# Patient Record
Sex: Female | Born: 1955 | Race: White | Hispanic: No | Marital: Married | Smoking: Never smoker
Health system: Southern US, Community
[De-identification: ages and names within clinical notes are randomized; demographics above are authoritative.]

## PROBLEM LIST (undated history)

## (undated) DIAGNOSIS — E559 Vitamin D deficiency, unspecified: Secondary | ICD-10-CM

## (undated) DIAGNOSIS — R002 Palpitations: Secondary | ICD-10-CM

## (undated) DIAGNOSIS — G8929 Other chronic pain: Secondary | ICD-10-CM

## (undated) DIAGNOSIS — K760 Fatty (change of) liver, not elsewhere classified: Secondary | ICD-10-CM

## (undated) DIAGNOSIS — R1310 Dysphagia, unspecified: Secondary | ICD-10-CM

## (undated) DIAGNOSIS — M722 Plantar fascial fibromatosis: Secondary | ICD-10-CM

## (undated) DIAGNOSIS — M51369 Other intervertebral disc degeneration, lumbar region without mention of lumbar back pain or lower extremity pain: Secondary | ICD-10-CM

## (undated) DIAGNOSIS — G4700 Insomnia, unspecified: Secondary | ICD-10-CM

## (undated) DIAGNOSIS — N289 Disorder of kidney and ureter, unspecified: Secondary | ICD-10-CM

## (undated) DIAGNOSIS — Z860100 Personal history of colon polyps, unspecified: Secondary | ICD-10-CM

## (undated) DIAGNOSIS — M7989 Other specified soft tissue disorders: Secondary | ICD-10-CM

## (undated) DIAGNOSIS — K589 Irritable bowel syndrome without diarrhea: Secondary | ICD-10-CM

## (undated) DIAGNOSIS — N059 Unspecified nephritic syndrome with unspecified morphologic changes: Secondary | ICD-10-CM

## (undated) DIAGNOSIS — E162 Hypoglycemia, unspecified: Secondary | ICD-10-CM

## (undated) DIAGNOSIS — G4733 Obstructive sleep apnea (adult) (pediatric): Secondary | ICD-10-CM

## (undated) DIAGNOSIS — M549 Dorsalgia, unspecified: Secondary | ICD-10-CM

## (undated) DIAGNOSIS — C4490 Unspecified malignant neoplasm of skin, unspecified: Secondary | ICD-10-CM

## (undated) DIAGNOSIS — K5900 Constipation, unspecified: Secondary | ICD-10-CM

## (undated) DIAGNOSIS — W3400XA Accidental discharge from unspecified firearms or gun, initial encounter: Secondary | ICD-10-CM

## (undated) DIAGNOSIS — K219 Gastro-esophageal reflux disease without esophagitis: Secondary | ICD-10-CM

## (undated) DIAGNOSIS — E119 Type 2 diabetes mellitus without complications: Secondary | ICD-10-CM

## (undated) DIAGNOSIS — Q210 Ventricular septal defect: Secondary | ICD-10-CM

## (undated) DIAGNOSIS — G9332 Myalgic encephalomyelitis/chronic fatigue syndrome: Secondary | ICD-10-CM

## (undated) DIAGNOSIS — I872 Venous insufficiency (chronic) (peripheral): Secondary | ICD-10-CM

## (undated) DIAGNOSIS — M797 Fibromyalgia: Secondary | ICD-10-CM

## (undated) DIAGNOSIS — M5136 Other intervertebral disc degeneration, lumbar region: Secondary | ICD-10-CM

## (undated) DIAGNOSIS — Y249XXA Unspecified firearm discharge, undetermined intent, initial encounter: Secondary | ICD-10-CM

## (undated) DIAGNOSIS — G4730 Sleep apnea, unspecified: Secondary | ICD-10-CM

## (undated) DIAGNOSIS — R5382 Chronic fatigue, unspecified: Secondary | ICD-10-CM

## (undated) DIAGNOSIS — I10 Essential (primary) hypertension: Secondary | ICD-10-CM

## (undated) DIAGNOSIS — E785 Hyperlipidemia, unspecified: Secondary | ICD-10-CM

## (undated) DIAGNOSIS — R609 Edema, unspecified: Secondary | ICD-10-CM

## (undated) DIAGNOSIS — M1990 Unspecified osteoarthritis, unspecified site: Secondary | ICD-10-CM

## (undated) DIAGNOSIS — M2550 Pain in unspecified joint: Secondary | ICD-10-CM

## (undated) HISTORY — DX: Palpitations: R00.2

## (undated) HISTORY — DX: Plantar fascial fibromatosis: M72.2

## (undated) HISTORY — PX: SKIN LESION EXCISION: SHX2412

## (undated) HISTORY — DX: Fatty (change of) liver, not elsewhere classified: K76.0

## (undated) HISTORY — DX: Dysphagia, unspecified: R13.10

## (undated) HISTORY — DX: Type 2 diabetes mellitus without complications: E11.9

## (undated) HISTORY — DX: Gastro-esophageal reflux disease without esophagitis: K21.9

## (undated) HISTORY — DX: Hypoglycemia, unspecified: E16.2

## (undated) HISTORY — DX: Irritable bowel syndrome, unspecified: K58.9

## (undated) HISTORY — DX: Ventricular septal defect: Q21.0

## (undated) HISTORY — DX: Constipation, unspecified: K59.00

## (undated) HISTORY — DX: Other specified soft tissue disorders: M79.89

## (undated) HISTORY — DX: Unspecified osteoarthritis, unspecified site: M19.90

## (undated) HISTORY — DX: Venous insufficiency (chronic) (peripheral): I87.2

## (undated) HISTORY — DX: Vitamin D deficiency, unspecified: E55.9

## (undated) HISTORY — PX: OTHER SURGICAL HISTORY: SHX169

## (undated) HISTORY — DX: Disorder of kidney and ureter, unspecified: N28.9

## (undated) HISTORY — DX: Morbid (severe) obesity due to excess calories: E66.01

## (undated) HISTORY — DX: Obstructive sleep apnea (adult) (pediatric): G47.33

## (undated) HISTORY — PX: COLONOSCOPY W/ POLYPECTOMY: SHX1380

## (undated) HISTORY — DX: Insomnia, unspecified: G47.00

## (undated) HISTORY — DX: Pain in unspecified joint: M25.50

## (undated) HISTORY — DX: Other intervertebral disc degeneration, lumbar region: M51.36

## (undated) HISTORY — DX: Essential (primary) hypertension: I10

## (undated) HISTORY — DX: Unspecified nephritic syndrome with unspecified morphologic changes: N05.9

## (undated) HISTORY — PX: CATARACT EXTRACTION: SUR2

## (undated) HISTORY — DX: Chronic fatigue, unspecified: R53.82

## (undated) HISTORY — DX: Myalgic encephalomyelitis/chronic fatigue syndrome: G93.32

## (undated) HISTORY — DX: Fibromyalgia: M79.7

## (undated) HISTORY — DX: Dorsalgia, unspecified: M54.9

## (undated) HISTORY — DX: Other chronic pain: G89.29

## (undated) HISTORY — DX: Unspecified malignant neoplasm of skin, unspecified: C44.90

## (undated) HISTORY — DX: Edema, unspecified: R60.9

## (undated) HISTORY — DX: Other intervertebral disc degeneration, lumbar region without mention of lumbar back pain or lower extremity pain: M51.369

## (undated) HISTORY — DX: Sleep apnea, unspecified: G47.30

## (undated) HISTORY — DX: Hyperlipidemia, unspecified: E78.5

## (undated) HISTORY — DX: Personal history of colon polyps, unspecified: Z86.0100

## (undated) HISTORY — DX: Personal history of colonic polyps: Z86.010

---

## 1983-06-24 HISTORY — PX: TUBAL LIGATION: SHX77

## 1991-06-24 HISTORY — PX: GALLBLADDER SURGERY: SHX652

## 1995-06-24 HISTORY — PX: PARTIAL HYSTERECTOMY: SHX80

## 1998-01-23 ENCOUNTER — Ambulatory Visit (HOSPITAL_COMMUNITY): Admission: RE | Admit: 1998-01-23 | Discharge: 1998-01-23 | Payer: Self-pay | Admitting: Unknown Physician Specialty

## 1999-12-20 ENCOUNTER — Ambulatory Visit (HOSPITAL_COMMUNITY): Admission: RE | Admit: 1999-12-20 | Discharge: 1999-12-20 | Payer: Self-pay | Admitting: Unknown Physician Specialty

## 1999-12-20 ENCOUNTER — Encounter: Payer: Self-pay | Admitting: Unknown Physician Specialty

## 2001-12-15 ENCOUNTER — Encounter: Payer: Self-pay | Admitting: Unknown Physician Specialty

## 2001-12-15 ENCOUNTER — Ambulatory Visit (HOSPITAL_COMMUNITY): Admission: RE | Admit: 2001-12-15 | Discharge: 2001-12-15 | Payer: Self-pay | Admitting: Unknown Physician Specialty

## 2001-12-20 ENCOUNTER — Encounter: Admission: RE | Admit: 2001-12-20 | Discharge: 2001-12-20 | Payer: Self-pay | Admitting: Unknown Physician Specialty

## 2001-12-20 ENCOUNTER — Encounter: Payer: Self-pay | Admitting: Unknown Physician Specialty

## 2002-12-19 ENCOUNTER — Ambulatory Visit (HOSPITAL_COMMUNITY): Admission: RE | Admit: 2002-12-19 | Discharge: 2002-12-19 | Payer: Self-pay | Admitting: Unknown Physician Specialty

## 2002-12-19 ENCOUNTER — Encounter: Payer: Self-pay | Admitting: Unknown Physician Specialty

## 2004-05-03 ENCOUNTER — Ambulatory Visit (HOSPITAL_COMMUNITY): Admission: RE | Admit: 2004-05-03 | Discharge: 2004-05-03 | Payer: Self-pay | Admitting: Obstetrics and Gynecology

## 2005-09-18 ENCOUNTER — Emergency Department (HOSPITAL_COMMUNITY): Admission: EM | Admit: 2005-09-18 | Discharge: 2005-09-18 | Payer: Self-pay | Admitting: Emergency Medicine

## 2005-09-23 ENCOUNTER — Ambulatory Visit (HOSPITAL_COMMUNITY): Admission: RE | Admit: 2005-09-23 | Discharge: 2005-09-23 | Payer: Self-pay | Admitting: Unknown Physician Specialty

## 2006-11-13 ENCOUNTER — Ambulatory Visit (HOSPITAL_COMMUNITY): Admission: RE | Admit: 2006-11-13 | Discharge: 2006-11-13 | Payer: Self-pay | Admitting: Unknown Physician Specialty

## 2007-11-16 ENCOUNTER — Ambulatory Visit (HOSPITAL_COMMUNITY): Admission: RE | Admit: 2007-11-16 | Discharge: 2007-11-16 | Payer: Self-pay | Admitting: Unknown Physician Specialty

## 2008-12-19 ENCOUNTER — Ambulatory Visit (HOSPITAL_COMMUNITY): Admission: RE | Admit: 2008-12-19 | Discharge: 2008-12-19 | Payer: Self-pay | Admitting: Unknown Physician Specialty

## 2010-08-09 ENCOUNTER — Other Ambulatory Visit: Payer: Self-pay | Admitting: *Deleted

## 2010-08-09 DIAGNOSIS — Z1231 Encounter for screening mammogram for malignant neoplasm of breast: Secondary | ICD-10-CM

## 2010-08-23 ENCOUNTER — Ambulatory Visit
Admission: RE | Admit: 2010-08-23 | Discharge: 2010-08-23 | Disposition: A | Discharge status: Home / Self Care | Payer: BC Managed Care – PPO | Source: Ambulatory Visit | Attending: *Deleted | Admitting: *Deleted

## 2010-08-23 DIAGNOSIS — Z1231 Encounter for screening mammogram for malignant neoplasm of breast: Secondary | ICD-10-CM

## 2010-08-27 ENCOUNTER — Other Ambulatory Visit: Payer: Self-pay | Admitting: *Deleted

## 2010-08-27 DIAGNOSIS — R928 Other abnormal and inconclusive findings on diagnostic imaging of breast: Secondary | ICD-10-CM

## 2010-09-02 ENCOUNTER — Ambulatory Visit
Admission: RE | Admit: 2010-09-02 | Discharge: 2010-09-02 | Disposition: A | Discharge status: Home / Self Care | Payer: BC Managed Care – PPO | Source: Ambulatory Visit | Attending: *Deleted | Admitting: *Deleted

## 2010-09-02 ENCOUNTER — Other Ambulatory Visit: Payer: Self-pay | Admitting: *Deleted

## 2010-09-02 DIAGNOSIS — R928 Other abnormal and inconclusive findings on diagnostic imaging of breast: Secondary | ICD-10-CM

## 2011-03-18 ENCOUNTER — Other Ambulatory Visit: Payer: Self-pay | Admitting: Unknown Physician Specialty

## 2011-03-18 DIAGNOSIS — N63 Unspecified lump in unspecified breast: Secondary | ICD-10-CM

## 2011-03-26 ENCOUNTER — Ambulatory Visit
Admission: RE | Admit: 2011-03-26 | Discharge: 2011-03-26 | Disposition: A | Discharge status: Home / Self Care | Payer: BC Managed Care – PPO | Source: Ambulatory Visit | Attending: Unknown Physician Specialty | Admitting: Unknown Physician Specialty

## 2011-03-26 DIAGNOSIS — N63 Unspecified lump in unspecified breast: Secondary | ICD-10-CM

## 2012-01-29 ENCOUNTER — Other Ambulatory Visit: Payer: Self-pay | Admitting: Unknown Physician Specialty

## 2012-01-29 DIAGNOSIS — N63 Unspecified lump in unspecified breast: Secondary | ICD-10-CM

## 2012-02-09 ENCOUNTER — Ambulatory Visit
Admission: RE | Admit: 2012-02-09 | Discharge: 2012-02-09 | Disposition: A | Discharge status: Home / Self Care | Payer: BC Managed Care – PPO | Source: Ambulatory Visit | Attending: Unknown Physician Specialty | Admitting: Unknown Physician Specialty

## 2012-02-09 DIAGNOSIS — N63 Unspecified lump in unspecified breast: Secondary | ICD-10-CM

## 2012-06-17 ENCOUNTER — Other Ambulatory Visit: Payer: Self-pay | Admitting: Physical Medicine and Rehabilitation

## 2012-06-17 DIAGNOSIS — M545 Low back pain: Secondary | ICD-10-CM

## 2012-06-18 ENCOUNTER — Other Ambulatory Visit: Payer: Self-pay | Admitting: Orthopedic Surgery

## 2012-06-18 DIAGNOSIS — M545 Low back pain: Secondary | ICD-10-CM

## 2012-06-19 ENCOUNTER — Ambulatory Visit
Admission: RE | Admit: 2012-06-19 | Discharge: 2012-06-19 | Disposition: A | Discharge status: Home / Self Care | Payer: BC Managed Care – PPO | Source: Ambulatory Visit | Attending: Orthopedic Surgery | Admitting: Orthopedic Surgery

## 2012-06-19 DIAGNOSIS — M545 Low back pain: Secondary | ICD-10-CM

## 2013-01-18 ENCOUNTER — Other Ambulatory Visit: Payer: Self-pay

## 2013-01-18 DIAGNOSIS — Z1231 Encounter for screening mammogram for malignant neoplasm of breast: Secondary | ICD-10-CM

## 2013-01-25 ENCOUNTER — Other Ambulatory Visit: Payer: Self-pay | Admitting: Physical Medicine and Rehabilitation

## 2013-01-25 ENCOUNTER — Ambulatory Visit
Admission: RE | Admit: 2013-01-25 | Discharge: 2013-01-25 | Disposition: A | Discharge status: Home / Self Care | Payer: BC Managed Care – PPO | Source: Ambulatory Visit | Attending: Physical Medicine and Rehabilitation | Admitting: Physical Medicine and Rehabilitation

## 2013-01-25 DIAGNOSIS — R609 Edema, unspecified: Secondary | ICD-10-CM

## 2013-01-25 DIAGNOSIS — R52 Pain, unspecified: Secondary | ICD-10-CM

## 2013-02-14 ENCOUNTER — Ambulatory Visit
Admission: RE | Admit: 2013-02-14 | Discharge: 2013-02-14 | Disposition: A | Discharge status: Home / Self Care | Payer: BC Managed Care – PPO | Source: Ambulatory Visit

## 2013-02-14 DIAGNOSIS — Z1231 Encounter for screening mammogram for malignant neoplasm of breast: Secondary | ICD-10-CM

## 2014-03-08 ENCOUNTER — Other Ambulatory Visit: Payer: Self-pay

## 2014-03-08 DIAGNOSIS — Z1231 Encounter for screening mammogram for malignant neoplasm of breast: Secondary | ICD-10-CM

## 2014-03-17 ENCOUNTER — Ambulatory Visit
Admission: RE | Admit: 2014-03-17 | Discharge: 2014-03-17 | Disposition: A | Discharge status: Home / Self Care | Payer: BC Managed Care – PPO | Source: Ambulatory Visit

## 2014-03-17 DIAGNOSIS — Z1231 Encounter for screening mammogram for malignant neoplasm of breast: Secondary | ICD-10-CM

## 2015-04-13 ENCOUNTER — Other Ambulatory Visit: Payer: Self-pay

## 2015-04-13 DIAGNOSIS — Z1231 Encounter for screening mammogram for malignant neoplasm of breast: Secondary | ICD-10-CM

## 2015-05-02 ENCOUNTER — Ambulatory Visit: Payer: BC Managed Care – PPO

## 2015-05-04 ENCOUNTER — Ambulatory Visit
Admission: RE | Admit: 2015-05-04 | Discharge: 2015-05-04 | Disposition: A | Discharge status: Home / Self Care | Payer: BC Managed Care – PPO | Source: Ambulatory Visit

## 2015-05-04 DIAGNOSIS — Z1231 Encounter for screening mammogram for malignant neoplasm of breast: Secondary | ICD-10-CM

## 2015-06-24 HISTORY — PX: MICRODISCECTOMY LUMBAR: SUR864

## 2015-09-14 ENCOUNTER — Other Ambulatory Visit: Payer: Self-pay | Admitting: Orthopaedic Surgery

## 2015-09-14 DIAGNOSIS — M4716 Other spondylosis with myelopathy, lumbar region: Secondary | ICD-10-CM

## 2015-09-23 ENCOUNTER — Ambulatory Visit
Admission: RE | Admit: 2015-09-23 | Discharge: 2015-09-23 | Disposition: A | Discharge status: Home / Self Care | Payer: BC Managed Care – PPO | Source: Ambulatory Visit | Attending: Orthopaedic Surgery | Admitting: Orthopaedic Surgery

## 2015-09-23 DIAGNOSIS — M4716 Other spondylosis with myelopathy, lumbar region: Secondary | ICD-10-CM

## 2016-06-03 ENCOUNTER — Other Ambulatory Visit: Payer: Self-pay | Admitting: Family Medicine

## 2016-06-03 DIAGNOSIS — Z1231 Encounter for screening mammogram for malignant neoplasm of breast: Secondary | ICD-10-CM

## 2016-07-01 ENCOUNTER — Ambulatory Visit
Admission: RE | Admit: 2016-07-01 | Discharge: 2016-07-01 | Disposition: A | Discharge status: Home / Self Care | Payer: BC Managed Care – PPO | Source: Ambulatory Visit | Attending: Family Medicine | Admitting: Family Medicine

## 2016-07-01 DIAGNOSIS — Z1231 Encounter for screening mammogram for malignant neoplasm of breast: Secondary | ICD-10-CM

## 2016-07-22 DIAGNOSIS — M961 Postlaminectomy syndrome, not elsewhere classified: Secondary | ICD-10-CM | POA: Insufficient documentation

## 2017-02-20 DIAGNOSIS — M1711 Unilateral primary osteoarthritis, right knee: Secondary | ICD-10-CM | POA: Insufficient documentation

## 2017-05-19 DIAGNOSIS — M8949 Other hypertrophic osteoarthropathy, multiple sites: Secondary | ICD-10-CM | POA: Insufficient documentation

## 2017-05-19 DIAGNOSIS — M159 Polyosteoarthritis, unspecified: Secondary | ICD-10-CM | POA: Insufficient documentation

## 2017-09-02 ENCOUNTER — Other Ambulatory Visit: Payer: Self-pay | Admitting: Family Medicine

## 2017-09-02 DIAGNOSIS — Z1231 Encounter for screening mammogram for malignant neoplasm of breast: Secondary | ICD-10-CM

## 2017-09-04 ENCOUNTER — Ambulatory Visit
Admission: RE | Admit: 2017-09-04 | Discharge: 2017-09-04 | Disposition: A | Discharge status: Home / Self Care | Payer: BC Managed Care – PPO | Source: Ambulatory Visit | Attending: Family Medicine | Admitting: Family Medicine

## 2017-09-04 DIAGNOSIS — Z1231 Encounter for screening mammogram for malignant neoplasm of breast: Secondary | ICD-10-CM

## 2018-11-08 ENCOUNTER — Other Ambulatory Visit: Payer: Self-pay | Admitting: Internal Medicine

## 2018-11-08 DIAGNOSIS — Z1231 Encounter for screening mammogram for malignant neoplasm of breast: Secondary | ICD-10-CM

## 2018-12-04 ENCOUNTER — Ambulatory Visit
Admission: RE | Admit: 2018-12-04 | Discharge: 2018-12-04 | Disposition: A | Discharge status: Home / Self Care | Payer: BC Managed Care – PPO | Source: Ambulatory Visit | Attending: Internal Medicine | Admitting: Internal Medicine

## 2018-12-04 ENCOUNTER — Other Ambulatory Visit: Payer: Self-pay

## 2018-12-04 DIAGNOSIS — Z1231 Encounter for screening mammogram for malignant neoplasm of breast: Secondary | ICD-10-CM

## 2018-12-23 DIAGNOSIS — M1711 Unilateral primary osteoarthritis, right knee: Secondary | ICD-10-CM | POA: Diagnosis not present

## 2018-12-23 DIAGNOSIS — M1712 Unilateral primary osteoarthritis, left knee: Secondary | ICD-10-CM | POA: Diagnosis not present

## 2018-12-23 DIAGNOSIS — M1612 Unilateral primary osteoarthritis, left hip: Secondary | ICD-10-CM | POA: Diagnosis not present

## 2018-12-23 DIAGNOSIS — Z6841 Body Mass Index (BMI) 40.0 and over, adult: Secondary | ICD-10-CM | POA: Diagnosis not present

## 2019-01-12 ENCOUNTER — Encounter (INDEPENDENT_AMBULATORY_CARE_PROVIDER_SITE_OTHER): Payer: Self-pay | Admitting: Family Medicine

## 2019-01-12 ENCOUNTER — Other Ambulatory Visit: Payer: Self-pay

## 2019-01-12 ENCOUNTER — Ambulatory Visit (INDEPENDENT_AMBULATORY_CARE_PROVIDER_SITE_OTHER): Payer: PPO | Admitting: Family Medicine

## 2019-01-12 VITALS — BP 122/74 | HR 65 | Temp 98.1°F | Ht 63.0 in | Wt 251.0 lb

## 2019-01-12 DIAGNOSIS — R5383 Other fatigue: Secondary | ICD-10-CM

## 2019-01-12 DIAGNOSIS — Z9189 Other specified personal risk factors, not elsewhere classified: Secondary | ICD-10-CM

## 2019-01-12 DIAGNOSIS — R0602 Shortness of breath: Secondary | ICD-10-CM | POA: Diagnosis not present

## 2019-01-12 DIAGNOSIS — E559 Vitamin D deficiency, unspecified: Secondary | ICD-10-CM

## 2019-01-12 DIAGNOSIS — I10 Essential (primary) hypertension: Secondary | ICD-10-CM | POA: Diagnosis not present

## 2019-01-12 DIAGNOSIS — Z6841 Body Mass Index (BMI) 40.0 and over, adult: Secondary | ICD-10-CM

## 2019-01-12 DIAGNOSIS — Z0289 Encounter for other administrative examinations: Secondary | ICD-10-CM

## 2019-01-12 DIAGNOSIS — Z1331 Encounter for screening for depression: Secondary | ICD-10-CM | POA: Diagnosis not present

## 2019-01-12 DIAGNOSIS — E1165 Type 2 diabetes mellitus with hyperglycemia: Secondary | ICD-10-CM

## 2019-01-12 MED ORDER — FREESTYLE SYSTEM KIT: 1.0000 | PACK | 0 refills | Status: DC | PRN

## 2019-01-12 NOTE — Progress Notes (Signed)
Office: [phone]  /  Fax: [phone]   Dear Dr. Berenice Leach,   Thank you for referring Sydney Leach to our clinic. The following note includes my evaluation and treatment recommendations.  HPI:   Chief Complaint: OBESITY    Sydney Leach has been referred by Sydney Leitz, MD for consultation regarding her obesity and obesity related comorbidities.    Sydney Leach (MR# [id number]) is a 63 y.o. female who presents on 01/12/2019 for obesity evaluation and treatment. Current BMI is Body mass index is 44.46 kg/m. Sydney Leach has been struggling with her weight for many years and has been unsuccessful in either losing weight, maintaining weight loss, or reaching her healthy weight goal.     Sydney Leach is to get a left hip replacement. She states for breakfast, she is doing 2 eggs, 1 slice of cheese, with 2 slices of bread. (satisfied), or a bowl of Special K (1.5 cup), with 2% milk. No snack in between. For lunch, she is doing a sandwich (2 oz of [place], 1 slice of cheese, and a tbsp of mustard). She occasionally snacks between lunch and dinner on popcorn. For dinner, she is doing spaghetti, Ziti, or 3 bean stew.     Sydney Leach attended our information session and states she is currently in the action stage of change and ready to dedicate time achieving and maintaining a healthier weight. Sydney Leach is interested in becoming our patient and working on intensive lifestyle modifications including (but not limited to) diet, exercise and weight loss.    Sydney Leach states her family eats meals together she thinks her family will eat healthier with  her she struggles with family and or coworkers weight loss sabotage her desired weight loss is 86 lbs she started gaining weight in 1990 her heaviest weight ever was 262 lbs she has significant food cravings issues  she wakes up frquently in the middle of the night to eat she skips meals frequently she is frequently drinking liquids with calories she  frequently makes poor food choices she struggles with emotional eating    Sydney Leach feels her energy is lower than it should be. This has worsened with weight gain and has not worsened recently. Sydney Leach admits to daytime somnolence and  admits to waking up still tired. Patient has a history of obstructive sleep apnea with the use of CPAP. Patent has a history of symptoms of daytime Sydney and morning headache. Patient generally gets 3 hours of sleep per night, and states they generally have nightime awakenings. Snoring is present. Apneic episodes are present. Epworth Sleepiness Score is 14.  Sydney Leach notes increasing shortness of breath with exercising and seems to be worsening over time with weight gain. She notes getting out of breath sooner with activity than she used to. This has not gotten worse recently. EKG-RBBB. Sydney Leach denies orthopnea.  Hypertension Sydney Leach is a 63 y.o. female with hypertension. Sydney Leach's blood pressure is controlled today. She denies chest pain, chest pressure, or headaches. She is on lisinopril and spironolactone. She is working on weight loss to help control her blood pressure with the goal of decreasing her risk of heart attack and stroke.   Diabetes II with Hyperglycemia Sydney Leach has a diagnosis of diabetes type II. Sydney Leach reports a recent A1c of 6.7. She has had this diagnosis for 9-10 years. She is on Victoza, but did have GI upset. She denies hypoglycemia. She has been working on intensive lifestyle modifications including diet, exercise, and weight loss to  help control her blood glucose levels.  At risk for cardiovascular disease [name] is at a higher than average risk for cardiovascular disease due to obesity, hypertension, and diabetes II. She currently denies any chest pain.  Vitamin D Deficiency [name] has a diagnosis of vitamin D deficiency. She is currently taking prescription Vit D 50,000 IU weekly. She notes Sydney  and denies nausea, vomiting or muscle weakness.  Depression Screen [name]'s Food and Mood (modified PHQ-9) score was  Depression screen PHQ 2/9 01/12/2019  Decreased Interest 1  Down, Depressed, Hopeless 3  PHQ - 2 Score 4  Altered sleeping 3  Tired, decreased energy 3  Change in appetite 2  Feeling bad or failure about yourself  1  Trouble concentrating 0  Moving slowly or fidgety/restless 2  Suicidal thoughts 1  PHQ-9 Score 16  Difficult doing work/chores Somewhat difficult    ASSESSMENT AND PLAN:  Other Sydney - Plan: EKG 12-Lead, T3, T4, free, TSH  Shortness of breath on exertion  Essential hypertension  Vitamin D deficiency - Plan: Vitamin B12, VITAMIN D 25 Hydroxy (Vit-D Deficiency, Fractures)  Type 2 diabetes mellitus with hyperglycemia, without long-term current use of insulin (HCC) - Plan: Microalbumin / creatinine urine ratio, Insulin, random, Comprehensive metabolic panel, glucose monitoring kit (FREESTYLE) monitoring kit  Depression screening  At risk for heart disease  Class 3 severe obesity with serious comorbidity and body mass index (BMI) of 40.0 to 44.9 in adult, unspecified obesity type (HCC)  PLAN:  Sydney Sydney Leach was informed that her Sydney may be related to obesity, depression or many other causes. Labs will be ordered, and in the meanwhile Sydney Leach has agreed to work on diet, exercise and weight loss to help with Sydney. Proper sleep hygiene was discussed including the need for 7-8 hours of quality sleep each night. A sleep study was not ordered based on symptoms and Epworth score.  Sydney on exertion Sydney Leach's shortness of breath appears to be obesity related and exercise induced. She has agreed to work on weight loss and gradually increase exercise to treat her exercise induced shortness of breath. If Cassara follows our instructions and loses weight without improvement of her shortness of breath, we will plan to refer to pulmonology. We will  monitor this condition regularly. Sydney Leach agrees to this plan.  Hypertension We discussed sodium restriction, working on healthy weight loss, and a regular exercise program as the means to achieve improved blood pressure control. Sydney Leach agreed with this plan and agreed to follow up as directed. We will continue to monitor her blood pressure as well as her progress with the above lifestyle modifications. Sydney Leach agrees to continue her medications as prescribed and will watch for signs of hypotension as she continues her lifestyle modifications. EKG was done, and we will check CMP today. Lonnette agrees to follow up with our clinic in 2 weeks.  Diabetes II with Hyperglycemia Sherrin has been given extensive diabetes education by myself today including ideal fasting and post-prandial blood glucose readings, individual ideal Hgb A1c goals and hypoglycemia prevention. We discussed the importance of good blood sugar control to decrease the likelihood of diabetic complications such as nephropathy, neuropathy, limb loss, blindness, coronary artery disease, and death. We discussed the importance of intensive lifestyle modification including diet, exercise and weight loss as the first line treatment for diabetes. Sydney Leach agrees to continue her diabetes medications and we will check Urine:MAB, and insulin level today. We will send Glucometer kit (starter kit) to the pharmacy. Sydney Leach agrees to follow  up with our clinic in 2 weeks.  Cardiovascular risk counseling Naylea was given extended (15 minutes) coronary artery disease prevention counseling today. She is 63 y.o. female and has risk factors for heart disease including obesity, hypertension, and diabetes II. We discussed intensive lifestyle modifications today with an emphasis on specific weight loss instructions and strategies. Pt was also informed of the importance of increasing exercise and decreasing saturated fats to help prevent heart disease.  Vitamin D  Deficiency Deisi was informed that low vitamin D levels contributes to Sydney and are associated with obesity, breast, and colon cancer. Jassmine agrees to continue taking prescription Vit D 50,000 IU every week and will follow up for routine testing of vitamin D, at least 2-3 times per year. She was informed of the risk of over-replacement of vitamin D and agrees to not increase her dose unless she discusses this with [place] first. We will check Vit D level today. Gracie agrees to follow up with our clinic in 2 weeks.  Depression Screen Shakerra had a strongly positive depression screening. Depression is commonly associated with obesity and often results in emotional eating behaviors. We will monitor this closely and work on CBT to help improve the non-hunger eating patterns. Referral to Psychology may be required if no improvement is seen as she continues in our clinic.  Obesity Khaleah is currently in the action stage of change and her goal is to continue with weight loss efforts. I recommend Tenaya begin the structured treatment plan as follows:  She has agreed to follow the Category 2 plan Marcos has been instructed to eventually work up to a goal of 150 minutes of combined cardio and strengthening exercise per week for weight loss and overall health benefits. We discussed the following Behavioral Modification Strategies today: increasing lean protein intake, increasing vegetables and work on meal planning and easy cooking plans, keeping healthy foods in the home, and planning for success   She was informed of the importance of frequent follow up visits to maximize her success with intensive lifestyle modifications for her multiple health conditions. She was informed we would discuss her lab results at her next visit unless there is a critical issue that needs to be addressed sooner. Lacreasha agreed to keep her next visit at the agreed upon time to discuss these results.  ALLERGIES: Allergies    Allergen Reactions   Clindamycin/Lincomycin    Erythromycin    Sulfa Antibiotics     MEDICATIONS: Current Outpatient Medications on File Prior to Visit  Medication Sig Dispense Refill   azelastine (ASTELIN) 0.1 % nasal spray Place 2 sprays into both nostrils 2 (two) times daily. Use in each nostril as directed     cetirizine (ZYRTEC) 10 MG tablet Take 10 mg by mouth daily.     dicyclomine (BENTYL) 20 MG tablet Take 20 mg by mouth every 6 (six) hours.     estradiol (ESTRACE) 0.5 MG tablet Take 0.5 mg by mouth every other day.     fluticasone (FLONASE) 50 MCG/ACT nasal spray Place 2 sprays into both nostrils daily.     lisinopril (ZESTRIL) 20 MG tablet Take 20 mg by mouth daily.     Loperamide HCl (IMODIUM A-D PO) Take by mouth 1 day or 1 dose.     meloxicam (MOBIC) 15 MG tablet Take 15 mg by mouth daily.     montelukast (SINGULAIR) 10 MG tablet Take 10 mg by mouth as needed.     Omega-3 Fatty Acids (FISH OIL) 1000  MG CAPS Take 1,000 mg by mouth.     omeprazole (PRILOSEC) 40 MG capsule Take 40 mg by mouth daily.     pregabalin (LYRICA) 150 MG capsule Take 150 mg by mouth 3 (three) times daily.     sitaGLIPtin-metformin (JANUMET) 50-1000 MG tablet Take 1 tablet by mouth 2 (two) times daily with a meal.     spironolactone (ALDACTONE) 25 MG tablet Take 25 mg by mouth daily.     traMADol (ULTRAM) 50 MG tablet Take 50 mg by mouth 3 (three) times daily. 1-2 TID     Vitamin D, Ergocalciferol, (DRISDOL) 1.25 MG (50000 UT) CAPS capsule Take 50,000 Units by mouth every 7 (seven) days.     No current facility-administered medications on file prior to visit.     PAST MEDICAL HISTORY: Past Medical History:  Diagnosis Date   Bilateral swelling of feet    Bright's disease    Chronic Sydney syndrome    Chronic pain    Chronic stasis dermatitis    Constipation    Degenerative arthritis    Edema    Fatty liver    Fibromyalgia    GERD (gastroesophageal reflux  disease)    History of colon polyps    Hypertension    IBS (irritable bowel syndrome)    Joint pain    Kidney problem    Lumbar disc narrowing    Morbid obesity (HCC)    Osteoarthritis    Plantar fasciitis    Skin cancer    Sleep apnea    Swallowing difficulty    Type 2 diabetes mellitus (HCC)    Ventricular septal defect    Vitamin D deficiency     PAST SURGICAL HISTORY: Past Surgical History:  Procedure Laterality Date   GALLBLADDER SURGERY  1993   MICRODISCECTOMY LUMBAR  2017   PARTIAL HYSTERECTOMY  1997   TUBAL LIGATION  1985    SOCIAL HISTORY: Social History   Tobacco Use   Smoking status: Never Smoker   Smokeless tobacco: Never Used  Substance Use Topics   Alcohol use: Not on file   Drug use: Not on file    FAMILY HISTORY: Family History  Problem Relation Age of Onset   Hyperlipidemia Mother    Heart disease Mother    Stroke Mother    Alcoholism Mother     ROS: Review of Systems  Constitutional: Positive for malaise/Sydney. Negative for weight loss.       + Trouble sleeping  HENT: Positive for sinus pain and tinnitus.        + Nasal stuffiness + Mouth sores  Eyes:       + Wear glasses or contacts  Respiratory: Positive for shortness of breath (with exertion).   Cardiovascular: Negative for chest pain and orthopnea.       Negative chest pressure + Sudden awakening from sleep with shortness of breath + Calf/leg pain with walking + Leg cramping  Gastrointestinal: Positive for constipation, diarrhea and heartburn. Negative for nausea and vomiting.  Musculoskeletal: Positive for back pain.       Negative muscle weakness + Neck stiffness + Muscle or joint pain + Muscle stiffness + Red or swollen joints  Skin: Positive for rash.       + Dryness  Neurological: Negative for headaches.  Endo/Heme/Allergies:       Negative hypoglycemia    PHYSICAL EXAM: Blood pressure 122/74, pulse 65, temperature 98.1 F (36.7 C),  temperature source Oral, height _0  (1.6 m), weight  251 lb (113.9 kg), SpO2 97 %. Body mass index is 44.46 kg/m. Physical Exam Vitals signs reviewed.  Constitutional:      Appearance: Normal appearance. She is obese.  HENT:     Head: Normocephalic and atraumatic.     Nose: Nose normal.  Eyes:     General: No scleral icterus.    Extraocular Movements: Extraocular movements intact.  Neck:     Musculoskeletal: Normal range of motion and neck supple.     Comments: No thyromegaly present Cardiovascular:     Rate and Rhythm: Normal rate and regular rhythm.     Pulses: Normal pulses.     Heart sounds: Normal heart sounds.  Pulmonary:     Effort: Pulmonary effort is normal. No respiratory distress.     Breath sounds: Normal breath sounds.  Abdominal:     Palpations: Abdomen is soft.     Tenderness: There is no abdominal tenderness.     Comments: + Obesity  Musculoskeletal: Normal range of motion.     Right lower leg: No edema.     Left lower leg: No edema.  Skin:    General: Skin is warm and dry.  Neurological:     Mental Status: She is alert and oriented to person, place, and time.     Coordination: Coordination normal.  Psychiatric:        Mood and Affect: Mood normal.        Behavior: Behavior normal.     RECENT LABS AND TESTS: BMET No results found for: NA, K, CL, CO2, GLUCOSE, BUN, CREATININE, CALCIUM, GFRNONAA, GFRAA No results found for: HGBA1C No results found for: INSULIN CBC No results found for: WBC, RBC, HGB, HCT, PLT, MCV, MCH, MCHC, RDW, LYMPHSABS, MONOABS, EOSABS, BASOSABS Iron/TIBC/Ferritin/ %Sat No results found for: IRON, TIBC, FERRITIN, IRONPCTSAT Lipid Panel  No results found for: CHOL, TRIG, HDL, CHOLHDL, VLDL, LDLCALC, LDLDIRECT Hepatic Function Panel  No results found for: PROT, ALBUMIN, AST, ALT, ALKPHOS, BILITOT, BILIDIR, IBILI No results found for: TSH  ECG  shows NSR with a rate of 68 BPM INDIRECT CALORIMETER done today shows a VO2 of 204  and a REE of 1427.  Her calculated basal metabolic rate is 8144 thus her basal metabolic rate is worse than expected.       OBESITY BEHAVIORAL INTERVENTION VISIT  Today's visit was # 1   Starting weight: 251 lbs Starting date: 01/12/2019 Today's weight : 251 lbs  Today's date: 01/12/2019 Total lbs lost to date: 0    ASK: We discussed the diagnosis of obesity with Karen Kays today and Chari agreed to give [place] permission to discuss obesity behavioral modification therapy today.  ASSESS: Macenzie has the diagnosis of obesity and her BMI today is 44.47 Shatima is in the action stage of change   ADVISE: Antwonette was educated on the multiple health risks of obesity as well as the benefit of weight loss to improve her health. She was advised of the need for long term treatment and the importance of lifestyle modifications to improve her current health and to decrease her risk of future health problems.  AGREE: Multiple dietary modification options and treatment options were discussed and  Shontelle agreed to follow the recommendations documented in the above note.  ARRANGE: Anyiah was educated on the importance of frequent visits to treat obesity as outlined per CMS and USPSTF guidelines and agreed to schedule her next follow up appointment today.  Wilhemena Durie, am acting as transcriptionist for  Ilene Qua, MD  I have reviewed the above documentation for accuracy and completeness, and I agree with the above. - Ilene Qua, MD

## 2019-01-13 LAB — COMPREHENSIVE METABOLIC PANEL
ALT: 76 IU/L — ABNORMAL HIGH (ref 0–32)
AST: 67 IU/L — ABNORMAL HIGH (ref 0–40)
Albumin/Globulin Ratio: 1.5 (ref 1.2–2.2)
Albumin: 4.2 g/dL (ref 3.8–4.8)
Alkaline Phosphatase: 131 IU/L — ABNORMAL HIGH (ref 39–117)
BUN/Creatinine Ratio: 22 (ref 12–28)
BUN: 16 mg/dL (ref 8–27)
Bilirubin Total: 0.3 mg/dL (ref 0.0–1.2)
CO2: 22 mmol/L (ref 20–29)
Calcium: 9.8 mg/dL (ref 8.7–10.3)
Chloride: 99 mmol/L (ref 96–106)
Creatinine, Ser: 0.72 mg/dL (ref 0.57–1.00)
GFR calc Af Amer: 102 mL/min/{1.73_m2} (ref >59)
GFR calc non Af Amer: 88 mL/min/{1.73_m2} (ref >59)
Globulin, Total: 2.8 g/dL (ref 1.5–4.5)
Glucose: 103 mg/dL — ABNORMAL HIGH (ref 65–99)
Potassium: 4.9 mmol/L (ref 3.5–5.2)
Sodium: 138 mmol/L (ref 134–144)
Total Protein: 7 g/dL (ref 6.0–8.5)

## 2019-01-13 LAB — TSH: TSH: 2.93 u[IU]/mL (ref 0.450–4.500)

## 2019-01-13 LAB — MICROALBUMIN / CREATININE URINE RATIO
Creatinine, Urine: 40.1 mg/dL
Microalb/Creat Ratio: 12 mg/g creat (ref 0–29)
Microalbumin, Urine: 4.9 ug/mL

## 2019-01-13 LAB — VITAMIN D 25 HYDROXY (VIT D DEFICIENCY, FRACTURES): Vit D, 25-Hydroxy: 68.9 ng/mL (ref 30.0–100.0)

## 2019-01-13 LAB — INSULIN, RANDOM: INSULIN: 21.3 u[IU]/mL (ref 2.6–24.9)

## 2019-01-13 LAB — T3: T3, Total: 118 ng/dL (ref 71–180)

## 2019-01-13 LAB — T4, FREE: Free T4: 1.4 ng/dL (ref 0.82–1.77)

## 2019-01-13 LAB — VITAMIN B12: Vitamin B-12: 325 pg/mL (ref 232–1245)
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
## 2019-01-24 ENCOUNTER — Other Ambulatory Visit: Payer: Self-pay

## 2019-01-24 ENCOUNTER — Ambulatory Visit (INDEPENDENT_AMBULATORY_CARE_PROVIDER_SITE_OTHER): Payer: PPO | Admitting: Psychology

## 2019-01-24 DIAGNOSIS — F3289 Other specified depressive episodes: Secondary | ICD-10-CM | POA: Diagnosis not present

## 2019-01-26 ENCOUNTER — Other Ambulatory Visit: Payer: Self-pay

## 2019-01-26 ENCOUNTER — Encounter (INDEPENDENT_AMBULATORY_CARE_PROVIDER_SITE_OTHER): Payer: Self-pay | Admitting: Family Medicine

## 2019-01-26 ENCOUNTER — Ambulatory Visit (INDEPENDENT_AMBULATORY_CARE_PROVIDER_SITE_OTHER): Payer: PPO | Admitting: Family Medicine

## 2019-01-26 VITALS — BP 120/69 | HR 70 | Temp 98.4°F | Ht 63.0 in | Wt 242.0 lb

## 2019-01-26 DIAGNOSIS — E559 Vitamin D deficiency, unspecified: Secondary | ICD-10-CM | POA: Diagnosis not present

## 2019-01-26 DIAGNOSIS — R945 Abnormal results of liver function studies: Secondary | ICD-10-CM | POA: Diagnosis not present

## 2019-01-26 DIAGNOSIS — Z6841 Body Mass Index (BMI) 40.0 and over, adult: Secondary | ICD-10-CM | POA: Diagnosis not present

## 2019-01-26 DIAGNOSIS — E1165 Type 2 diabetes mellitus with hyperglycemia: Secondary | ICD-10-CM | POA: Diagnosis not present

## 2019-01-26 DIAGNOSIS — R7989 Other specified abnormal findings of blood chemistry: Secondary | ICD-10-CM

## 2019-01-26 MED ORDER — BLOOD GLUCOSE METER KIT: PACK | 0 refills | Status: DC

## 2019-01-26 NOTE — Progress Notes (Signed)
Office: [phone]  /  Fax: [phone]   HPI:   Chief Complaint: OBESITY Makinzee is here to discuss her progress with her obesity treatment plan. She is on the Category 2 plan and is following her eating plan approximately 90 % of the time. She states she is walking in the pool for 2 hours 4 times per week. Pamla went to a time share at [employer], and she thinks if she hadn't gone away she would have done better. She felt better the last few weeks. She notes hunger around 3 hours. She is substituting crackers for bread calories. She is alternating between microwave meals and sandwiches for lunch.  Her weight is 242 lb (109.8 kg) today and has had a weight loss of 9 pounds over a period of 2 weeks since her last visit. She has lost 9 lbs since starting treatment with [place].  Diabetes II with Hyperglycemia Liddy has a diagnosis of diabetes type II. Iliani's last Hgb A1c was of 6.7 and insulin 21.3. She notes hunger. She has been working on intensive lifestyle modifications including diet, exercise, and weight loss to help control her blood glucose levels.  Vitamin D Deficiency Jacquelinne has a diagnosis of vitamin D deficiency. She is currently taking prescription Vit D. Last Vit D level was of 68.9. She notes fatigue and denies nausea, vomiting or muscle weakness.   At risk for osteopenia and osteoporosis Collene is at higher risk of osteopenia and osteoporosis due to vitamin D deficiency.   Elevated LFTs Cedric's Alk Phos, AST, and Alt are all elevated, fatty liver likely. BMI is over 40. She denies abdominal pain or jaundice and has never been told of any liver problems in the past. She denies excessive alcohol intake.  ASSESSMENT AND PLAN:  Type 2 diabetes mellitus with hyperglycemia, without long-term current use of insulin (HCC)  Vitamin D deficiency  Elevated LFTs  At risk for osteoporosis  Class 3 severe obesity with serious comorbidity and body mass index (BMI) of 40.0  to 44.9 in adult, unspecified obesity type ([place])  PLAN:  Diabetes II with Hyperglycemia Aseret has been given extensive diabetes education by myself today including ideal fasting and post-prandial blood glucose readings, individual ideal Hgb A1c goals and hypoglycemia prevention. We discussed the importance of good blood sugar control to decrease the likelihood of diabetic complications such as nephropathy, neuropathy, limb loss, blindness, coronary artery disease, and death. We discussed the importance of intensive lifestyle modification including diet, exercise and weight loss as the first line treatment for diabetes. Nixon agrees to continue her diabetes medications, and she is to check BGs at least 1 time a day (at least fasting). The goal is to separate and start to decrease sitagliptin. We will send blood glucose monitoring kit to the pharmacy. Leonora agrees to follow up with our clinic in 2 weeks.  Vitamin D Deficiency Danila was informed that low vitamin D levels contributes to fatigue and are associated with obesity, breast, and colon cancer. Aily agrees to change to prescription Vit D 50,000 IU every 14 days and will follow up for routine testing of vitamin D, at least 2-3 times per year. She was informed of the risk of over-replacement of vitamin D and agrees to not increase her dose unless she discusses this with [place] first. Tameria agrees to follow up with our clinic in 2 weeks.  At risk for osteopenia and osteoporosis Shamarie was given extended (30 minutes) osteoporosis prevention counseling today. Hadia is at risk for  osteopenia and osteoporsis due to her vitamin D deficiency. She was encouraged to take her vitamin D and follow her higher calcium diet and increase strengthening exercise to help strengthen her bones and decrease her risk of osteopenia and osteoporosis.  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 Kerissa was educated on her risk of developing NASH or even liver failure and th only proven treatment for NAFLD was weight loss. Jeneen agreed to continue with her weight loss efforts with healthier diet and exercise as an essential part of her treatment plan. We will repeat CMP in 3 months. Cera agrees to follow up with our clinic in 2 weeks.  Obesity Zenya is currently in the action stage of change. As such, her goal is to continue with weight loss efforts She has agreed to follow the Category 2 plan + 100 calories Tiffanni has been instructed to work up to a goal of 150 minutes of combined cardio and strengthening exercise per week for weight loss and overall health benefits. We discussed the following Behavioral Modification Strategies today: increasing lean protein intake, increasing vegetables, work on meal planning and easy cooking plans, and keeping healthy foods in the home   Devone has agreed to follow up with our clinic in 2 weeks. She was informed of the importance of frequent follow up visits to maximize her success with intensive lifestyle modifications for her multiple health conditions.  ALLERGIES: Allergies  Allergen Reactions   Clindamycin/Lincomycin    Erythromycin    Sulfa Antibiotics     MEDICATIONS: Current Outpatient Medications on File Prior to Visit  Medication Sig Dispense Refill   azelastine (ASTELIN) 0.1 % nasal spray Place 2 sprays into both nostrils 2 (two) times daily. Use in each nostril as directed     cetirizine (ZYRTEC) 10 MG tablet Take 10 mg by mouth daily.     dicyclomine (BENTYL) 20 MG tablet Take 20 mg by mouth every 6 (six) hours.     estradiol (ESTRACE) 0.5 MG tablet Take 0.5 mg by mouth every other day.     fluticasone (FLONASE) 50 MCG/ACT nasal spray Place 2 sprays into both nostrils daily.     glucose monitoring kit (FREESTYLE) monitoring kit 1 each by Does not apply route as needed for other. Test  twice daily 1 each 0   lisinopril (ZESTRIL) 20 MG tablet Take 20 mg by mouth daily.     Loperamide HCl (IMODIUM A-D PO) Take by mouth 1 day or 1 dose.     meloxicam (MOBIC) 15 MG tablet Take 15 mg by mouth daily.     montelukast (SINGULAIR) 10 MG tablet Take 10 mg by mouth as needed.     Omega-3 Fatty Acids (FISH OIL) 1000 MG CAPS Take 1,000 mg by mouth.     omeprazole (PRILOSEC) 40 MG capsule Take 40 mg by mouth daily.     pregabalin (LYRICA) 150 MG capsule Take 150 mg by mouth 3 (three) times daily.     sitaGLIPtin-metformin (JANUMET) 50-1000 MG tablet Take 1 tablet by mouth 2 (two) times daily with a meal.     spironolactone (ALDACTONE) 25 MG tablet Take 25 mg by mouth daily.     traMADol (ULTRAM) 50 MG tablet Take 50 mg by mouth 3 (three) times daily. 1-2 TID     Vitamin D, Ergocalciferol, (DRISDOL) 1.25 MG (50000 UT) CAPS capsule Take 50,000 Units by mouth every 7 (seven) days.  No current facility-administered medications on file prior to visit.     PAST MEDICAL HISTORY: Past Medical History:  Diagnosis Date   Bilateral swelling of feet    Bright's disease    Chronic fatigue syndrome    Chronic pain    Chronic stasis dermatitis    Constipation    Degenerative arthritis    Edema    Fatty liver    Fibromyalgia    GERD (gastroesophageal reflux disease)    History of colon polyps    Hypertension    IBS (irritable bowel syndrome)    Joint pain    Kidney problem    Lumbar disc narrowing    Morbid obesity (HCC)    Osteoarthritis    Plantar fasciitis    Skin cancer    Sleep apnea    Swallowing difficulty    Type 2 diabetes mellitus (HCC)    Ventricular septal defect    Vitamin D deficiency     PAST SURGICAL HISTORY: Past Surgical History:  Procedure Laterality Date   GALLBLADDER SURGERY  1993   MICRODISCECTOMY LUMBAR  2017   PARTIAL HYSTERECTOMY  1997   TUBAL LIGATION  1985    SOCIAL HISTORY: Social History   Tobacco  Use   Smoking status: Never Smoker   Smokeless tobacco: Never Used  Substance Use Topics   Alcohol use: Not on file   Drug use: Not on file    FAMILY HISTORY: Family History  Problem Relation Age of Onset   Hyperlipidemia Mother    Heart disease Mother    Stroke Mother    Alcoholism Mother     ROS: Review of Systems  Constitutional: Positive for malaise/fatigue and weight loss.  Eyes:       Negative jaundice  Gastrointestinal: Negative for abdominal pain, nausea and vomiting.  Musculoskeletal:       Negative muscle weakness  Endo/Heme/Allergies:       Negative hypoglycemia    PHYSICAL EXAM: Blood pressure 120/69, pulse 70, temperature 98.4 F (36.9 C), temperature source Oral, height '5\' 3"'  (1.6 m), weight 242 lb (109.8 kg), SpO2 95 %. Body mass index is 42.87 kg/m. Physical Exam Vitals signs reviewed.  Constitutional:      Appearance: Normal appearance. She is obese.  Cardiovascular:     Rate and Rhythm: Normal rate.     Pulses: Normal pulses.  Pulmonary:     Effort: Pulmonary effort is normal.     Breath sounds: Normal breath sounds.  Musculoskeletal: Normal range of motion.  Skin:    General: Skin is warm and dry.  Neurological:     Mental Status: She is alert and oriented to person, place, and time.  Psychiatric:        Mood and Affect: Mood normal.        Behavior: Behavior normal.     RECENT LABS AND TESTS: BMET    Component Value Date/Time   NA 138 01/12/2019 1438   K 4.9 01/12/2019 1438   CL 99 01/12/2019 1438   CO2 22 01/12/2019 1438   GLUCOSE 103 (H) 01/12/2019 1438   BUN 16 01/12/2019 1438   CREATININE 0.72 01/12/2019 1438   CALCIUM 9.8 01/12/2019 1438   GFRNONAA 88 01/12/2019 1438   GFRAA 102 01/12/2019 1438   No results found for: HGBA1C Lab Results  Component Value Date   INSULIN 21.3 01/12/2019   CBC No results found for: WBC, RBC, HGB, HCT, PLT, MCV, MCH, MCHC, RDW, LYMPHSABS, MONOABS, EOSABS,  BASOSABS Iron/TIBC/Ferritin/ %  Sat No results found for: IRON, TIBC, FERRITIN, IRONPCTSAT Lipid Panel  No results found for: CHOL, TRIG, HDL, CHOLHDL, VLDL, LDLCALC, LDLDIRECT Hepatic Function Panel     Component Value Date/Time   PROT 7.0 01/12/2019 1438   ALBUMIN 4.2 01/12/2019 1438   AST 67 (H) 01/12/2019 1438   ALT 76 (H) 01/12/2019 1438   ALKPHOS 131 (H) 01/12/2019 1438   BILITOT 0.3 01/12/2019 1438      Component Value Date/Time   TSH 2.930 01/12/2019 1438      OBESITY BEHAVIORAL INTERVENTION VISIT  Today's visit was # 2   Starting weight: 251 lbs Starting date: 01/12/2019 Today's weight : 242 lbs  Today's date: 01/26/2019 Total lbs lost to date: 9    ASK: We discussed the diagnosis of obesity with Karen Kays today and Chakara agreed to give [place] permission to discuss obesity behavioral modification therapy today.  ASSESS: Taeko has the diagnosis of obesity and her BMI today is 42.88 Maryalyce is in the action stage of change   ADVISE: Zeidy was educated on the multiple health risks of obesity as well as the benefit of weight loss to improve her health. She was advised of the need for long term treatment and the importance of lifestyle modifications to improve her current health and to decrease her risk of future health problems.  AGREE: Multiple dietary modification options and treatment options were discussed and  Melvin agreed to follow the recommendations documented in the above note.  ARRANGE: Deniece was educated on the importance of frequent visits to treat obesity as outlined per CMS and USPSTF guidelines and agreed to schedule her next follow up appointment today.  I, Trixie Dredge, am acting as transcriptionist for Ilene Qua, MD  I have reviewed the above documentation for accuracy and completeness, and I agree with the above. - Ilene Qua, MD

## 2019-01-28 ENCOUNTER — Encounter (INDEPENDENT_AMBULATORY_CARE_PROVIDER_SITE_OTHER): Payer: Self-pay | Admitting: Family Medicine

## 2019-01-31 NOTE — Telephone Encounter (Signed)
Please advise 

## 2019-02-02 NOTE — Progress Notes (Signed)
Office: [phone]  /  Fax: [phone]    Date: February 07, 2019   Appointment Start Time: 8:35am Duration: 28 minutes Provider: Glennie Isle, Psy.D. Type of Session: Individual Therapy  Location of Patient: Home Location of Provider: [facility] Type of Contact: Telepsychological Visit via [employer] Web[employer]   Session Content: Sydney Leach is a 63 y.o. female presenting via [place] for a follow-up appointment to address the previously established treatment goal of decreasing emotional eating. Of note, this provider called Sydney Leach at 8:32am, as she did not present for today's appointment. She noted difficulty connecting; therefore, directions were provided and the e-mail with the secure link was re-sent. As such, today's appointment was initiated 5 minutes late. Today's appointment was a telepsychological visit, as this provider's clinic is seeing a limited number of patients for in-person visits due to COVID-19. Therapeutic services will resume to in-person appointments once deemed appropriate. Sydney Leach expressed understanding regarding the rationale for telepsychological services, and provided verbal consent for today's appointment. Prior to proceeding with today's appointment, Sydney Leach's physical location at the time of this appointment was obtained. Sydney Leach reported she was at home and provided the address. In the event of technical difficulties, Sydney Leach shared a phone number she could be reached at. Sydney Leach and this provider participated in today's telepsychological service. Also, Sydney Leach denied anyone else being present in the room or on the WebEx appointment.  This provider conducted a brief check-in and verbally administered the PHQ-9 and GAD-7. Sydney Leach reported, "Let's just say, this diet is a diet from hell." This was explored further. She shared the meal plan is "depriving" her of foods she enjoys and has foods she "hates." It was recommended she speak about options with  Dr. Adair Patter during their next appointment; she agreed. Additionally, she believes her "body is starving" due to a reduction in carbohydrates. Thus, psychoeducation regarding protein and its impact on fullness was provided. This was provider also discussed various ways to utilize her snack calories to assist with feeling deprived. She noted a goal to explore calories for servings of grits, potatoes, and a donut/pastry. Moreover, psychoeducation regarding all or nothing thinking was provided based on the aforementioned. Furthermore, Sydney Leach shared she was "not eating much to begin with" prior to starting with the clinic. As such, this provider recommended eating frequently throughout the day to ensure she is able to eat all foods noted on the structured meal plan. She agreed. She also discussed social isolation due to COVID-19 has impacted her mood. Thus, this provider discussed using video applications to connect with friends and family. She shared a plan to use FaceTime. Overall, Sydney Leach was receptive to today's session as evidenced by openness to sharing, responsiveness to feedback, and willingness to implement discussed strategies.  Mental Status Examination:  Appearance: neat Behavior: cooperative Mood: euthymic Affect: mood congruent Speech: normal in rate, volume, and tone Eye Contact: appropriate Psychomotor Activity: appropriate Thought Process: linear, logical, and goal directed  Content/Perceptual Disturbances: no hallucinations, delusions, bizarre thinking or behavior reported or observed and no evidence of suicidal and homicidal ideation, plan, and intent Orientation: time, person, place and purpose of appointment Cognition/Sensorium: memory, attention, language, and fund of knowledge intact  Insight: fair Judgment: fair  Structured Assessment Results: The Patient Health Questionnaire-9 (PHQ-9) is a self-report measure that assesses symptoms and severity of depression over the course of  the last two weeks. Sydney Leach obtained a score of 8 suggesting mild depression. Sydney Leach finds the endorsed symptoms to be somewhat difficult. Little interest  or pleasure in doing things 0  Feeling down, depressed, or hopeless 2  Trouble falling or staying asleep, or sleeping too much 3  Feeling tired or having little energy 3  Poor appetite or overeating 0  Feeling bad about yourself --- or that you are a failure or have let yourself or your family down 0  Trouble concentrating on things, such as reading the newspaper or watching television 0  Moving or speaking so slowly that other people could have noticed? Or the opposite --- being so fidgety or restless that you have been moving around a lot more than usual 0  Thoughts that you would be better off dead or hurting yourself in some way 0  PHQ-9 Score 8    The Generalized Anxiety Disorder-7 (GAD-7) is a brief self-report measure that assesses symptoms of anxiety over the course of the last two weeks. Sydney Leach obtained a score of 5 suggesting mild anxiety. Sydney Leach finds the endorsed symptoms to be not difficult at all. Feeling nervous, anxious, on edge 2  Not being able to stop or control worrying 0  Worrying too much about different things 0  Trouble relaxing 0  Being so restless that it's hard to sit still 1  Becoming easily annoyed or irritable 2  Feeling afraid as if something awful might happen 0  GAD-7 Score 5   Interventions:  Conducted a brief chart review Verbal administration of PHQ-9 and GAD-7 for symptom monitoring Provided empathic reflections and validation Engaged patient in problem solving Focused on rapport building Psychoeducation provided regarding all-or-nothing thinking Employed supportive psychotherapy interventions to facilitate reduced distress, and to improve coping skills with identified stressors Psychoeducation provided regarding protein intake  DSM-5 Diagnosis: 311 (F32.8) Other Specified Depressive Disorder,  Emotional Eating Behaviors  Treatment Goal & Progress: During the initial appointment with this provider, the following treatment goal was established: decrease emotional eating. Progress is limited, as Sydney Leach has just begun treatment with this provider; however, she is receptive to the interaction and interventions and rapport is being established.   Plan: Sydney Leach continues to appear able and willing to participate as evidenced by engagement in reciprocal conversation, and asking questions for clarification as appropriate. The next appointment will be scheduled in three weeks, which will be via [employer]. The next session will focus on the introduction of triggers for emotional eating as they were not introduced today based on Sydney Leach' presenting concerns.

## 2019-02-04 ENCOUNTER — Encounter (INDEPENDENT_AMBULATORY_CARE_PROVIDER_SITE_OTHER): Payer: Self-pay | Admitting: Family Medicine

## 2019-02-07 ENCOUNTER — Ambulatory Visit (INDEPENDENT_AMBULATORY_CARE_PROVIDER_SITE_OTHER): Payer: PPO | Admitting: Psychology

## 2019-02-07 ENCOUNTER — Other Ambulatory Visit (INDEPENDENT_AMBULATORY_CARE_PROVIDER_SITE_OTHER): Payer: Self-pay

## 2019-02-07 ENCOUNTER — Other Ambulatory Visit: Payer: Self-pay

## 2019-02-07 DIAGNOSIS — F3289 Other specified depressive episodes: Secondary | ICD-10-CM | POA: Diagnosis not present

## 2019-02-07 DIAGNOSIS — E1165 Type 2 diabetes mellitus with hyperglycemia: Secondary | ICD-10-CM

## 2019-02-07 MED ORDER — FREESTYLE SYSTEM KIT: 1.0000 | PACK | 0 refills | Status: DC | PRN

## 2019-02-09 ENCOUNTER — Encounter (INDEPENDENT_AMBULATORY_CARE_PROVIDER_SITE_OTHER): Payer: Self-pay | Admitting: Family Medicine

## 2019-02-09 ENCOUNTER — Other Ambulatory Visit: Payer: Self-pay

## 2019-02-09 ENCOUNTER — Ambulatory Visit (INDEPENDENT_AMBULATORY_CARE_PROVIDER_SITE_OTHER): Payer: PPO | Admitting: Family Medicine

## 2019-02-09 VITALS — BP 143/75 | HR 68 | Temp 97.9°F | Ht 63.0 in | Wt 239.0 lb

## 2019-02-09 DIAGNOSIS — I10 Essential (primary) hypertension: Secondary | ICD-10-CM | POA: Diagnosis not present

## 2019-02-09 DIAGNOSIS — Z6841 Body Mass Index (BMI) 40.0 and over, adult: Secondary | ICD-10-CM

## 2019-02-09 DIAGNOSIS — E1165 Type 2 diabetes mellitus with hyperglycemia: Secondary | ICD-10-CM | POA: Diagnosis not present

## 2019-02-10 DIAGNOSIS — Z6841 Body Mass Index (BMI) 40.0 and over, adult: Secondary | ICD-10-CM | POA: Diagnosis not present

## 2019-02-10 DIAGNOSIS — E1122 Type 2 diabetes mellitus with diabetic chronic kidney disease: Secondary | ICD-10-CM | POA: Diagnosis not present

## 2019-02-10 DIAGNOSIS — Z9181 History of falling: Secondary | ICD-10-CM | POA: Diagnosis not present

## 2019-02-10 DIAGNOSIS — K219 Gastro-esophageal reflux disease without esophagitis: Secondary | ICD-10-CM | POA: Diagnosis not present

## 2019-02-10 DIAGNOSIS — E559 Vitamin D deficiency, unspecified: Secondary | ICD-10-CM | POA: Diagnosis not present

## 2019-02-10 DIAGNOSIS — E785 Hyperlipidemia, unspecified: Secondary | ICD-10-CM | POA: Diagnosis not present

## 2019-02-10 DIAGNOSIS — N951 Menopausal and female climacteric states: Secondary | ICD-10-CM | POA: Diagnosis not present

## 2019-02-10 DIAGNOSIS — M797 Fibromyalgia: Secondary | ICD-10-CM | POA: Diagnosis not present

## 2019-02-10 DIAGNOSIS — I1310 Hypertensive heart and chronic kidney disease without heart failure, with stage 1 through stage 4 chronic kidney disease, or unspecified chronic kidney disease: Secondary | ICD-10-CM | POA: Diagnosis not present

## 2019-02-10 DIAGNOSIS — N182 Chronic kidney disease, stage 2 (mild): Secondary | ICD-10-CM | POA: Diagnosis not present

## 2019-02-15 NOTE — Progress Notes (Signed)
Office: [phone]  /  Fax: [phone]   HPI:   Chief Complaint: OBESITY Sydney Leach is here to discuss her progress with her obesity treatment plan. She is on the Category 2 plan + 100 calories and is following her eating plan approximately 100 % of the time. She states she is exercising 0 minutes 0 times per week. Sydney Leach voices she has been so hungry for the past 2 weeks, even eating all the food on the plan. She voices it's not craving, but true hunger.  Her weight is 239 lb (108.4 kg) today and has had a weight loss of 3 pounds over a period of 2 weeks since her last visit. She has lost 12 lbs since starting treatment with [place].  Diabetes II with Hyperglycemia Sydney Leach has a diagnosis of diabetes type II. Sydney Leach is to discuss transition to metformin only instead of Janumet. She denies hypoglycemia. She has been working on intensive lifestyle modifications including diet, exercise, and weight loss to help control her blood glucose levels.  Hypertension Sydney Leach is a 63 y.o. female with hypertension. Sydney Leach's blood pressure is slightly elevated today. She wants to check if she can get off blood pressure medications. She denies chest pain, chest pressure, or headaches. She is working on weight loss to help control her blood pressure with the goal of decreasing her risk of heart attack and stroke.   ASSESSMENT AND PLAN:  Type 2 diabetes mellitus with hyperglycemia, without long-term current use of insulin (HCC)  Essential hypertension  Class 3 severe obesity with serious comorbidity and body mass index (BMI) of 40.0 to 44.9 in adult, unspecified obesity type ([place])  PLAN:  Diabetes II with Hyperglycemia Sydney Leach has been given extensive diabetes education by myself today including ideal fasting and post-prandial blood glucose readings, individual ideal Hgb A1c goals and hypoglycemia prevention. We discussed the importance of good blood sugar control to decrease the likelihood  of diabetic complications such as nephropathy, neuropathy, limb loss, blindness, coronary artery disease, and death. We discussed the importance of intensive lifestyle modification including diet, exercise and weight loss as the first line treatment for diabetes. Sydney Leach is to follow up with her primary care physician tomorrow at her previously scheduled appointment. We will send Freestyle Lite lancets and Freestyle Lite testing strips to the pharmacy. Sydney Leach agrees to follow up with our clinic in 2 to 3 weeks.  Hypertension We discussed sodium restriction, working on healthy weight loss, and a regular exercise program as the means to achieve improved blood pressure control. Sydney Leach agreed with this plan and agreed to follow up as directed. We will continue to monitor her blood pressure as well as her progress with the above lifestyle modifications. Sydney Leach will continue her medications and she is to discuss transition to chlorthalidone with her primary care physician. She will watch for signs of hypotension as she continues her lifestyle modifications. Sydney Leach agrees to follow up with our clinic in 2 to 3 weeks.  Obesity Sydney Leach is currently in the action stage of change. As such, her goal is to continue with weight loss efforts She has agreed to follow the Category 3 plan Sydney Leach has been instructed to work up to a goal of 150 minutes of combined cardio and strengthening exercise per week for weight loss and overall health benefits. We discussed the following Behavioral Modification Strategies today: increasing lean protein intake, increasing vegetables and work on meal planning and easy cooking plans, keeping healthy foods in the home, and planning for success  Sydney Leach has agreed to follow up with our clinic in 2 to 3 weeks. She was informed of the importance of frequent follow up visits to maximize her success with intensive lifestyle modifications for her multiple health conditions.  ALLERGIES:  Allergies  Allergen Reactions  . Clindamycin/Lincomycin   . Erythromycin   . Sulfa Antibiotics     MEDICATIONS: Current Outpatient Medications on File Prior to Visit  Medication Sig Dispense Refill  . azelastine (ASTELIN) 0.1 % nasal spray Place 2 sprays into both nostrils 2 (two) times daily. Use in each nostril as directed    . blood glucose meter kit and supplies Dispense based on patient and insurance preference. Use up to four times daily as directed. (FOR ICD-10 E10.9, E11.9). 1 each 0  . cetirizine (ZYRTEC) 10 MG tablet Take 10 mg by mouth daily.    Marland Kitchen dicyclomine (BENTYL) 20 MG tablet Take 20 mg by mouth every 6 (six) hours.    Marland Kitchen estradiol (ESTRACE) 0.5 MG tablet Take 0.5 mg by mouth every other day.    . fluticasone (FLONASE) 50 MCG/ACT nasal spray Place 2 sprays into both nostrils daily.    Marland Kitchen glucose monitoring kit (FREESTYLE) monitoring kit 1 each by Does not apply route as needed for other. Test twice daily 1 each 0  . lisinopril (ZESTRIL) 20 MG tablet Take 20 mg by mouth daily.    . Loperamide HCl (IMODIUM A-D PO) Take by mouth 1 day or 1 dose.    . meloxicam (MOBIC) 15 MG tablet Take 15 mg by mouth daily.    . montelukast (SINGULAIR) 10 MG tablet Take 10 mg by mouth as needed.    . Omega-3 Fatty Acids (FISH OIL) 1000 MG CAPS Take 1,000 mg by mouth.    Marland Kitchen omeprazole (PRILOSEC) 40 MG capsule Take 40 mg by mouth daily.    . pregabalin (LYRICA) 150 MG capsule Take 150 mg by mouth 3 (three) times daily.    . sitaGLIPtin-metformin (JANUMET) 50-1000 MG tablet Take 1 tablet by mouth 2 (two) times daily with a meal.    . spironolactone (ALDACTONE) 25 MG tablet Take 25 mg by mouth daily.    . traMADol (ULTRAM) 50 MG tablet Take 50 mg by mouth 3 (three) times daily. 1-2 TID    . Vitamin D, Ergocalciferol, (DRISDOL) 1.25 MG (50000 UT) CAPS capsule Take 50,000 Units by mouth every 7 (seven) days.     No current facility-administered medications on file prior to visit.     PAST MEDICAL  HISTORY: Past Medical History:  Diagnosis Date  . Bilateral swelling of feet   . Bright's disease   . Chronic fatigue syndrome   . Chronic pain   . Chronic stasis dermatitis   . Constipation   . Degenerative arthritis   . Edema   . Fatty liver   . Fibromyalgia   . GERD (gastroesophageal reflux disease)   . History of colon polyps   . Hypertension   . IBS (irritable bowel syndrome)   . Joint pain   . Kidney problem   . Lumbar disc narrowing   . Morbid obesity ([place])   . Osteoarthritis   . Plantar fasciitis   . Skin cancer   . Sleep apnea   . Swallowing difficulty   . Type 2 diabetes mellitus ([place])   . Ventricular septal defect   . Vitamin D deficiency     PAST SURGICAL HISTORY: Past Surgical History:  Procedure Laterality Date  . GALLBLADDER SURGERY  1993  . MICRODISCECTOMY LUMBAR  2017  . PARTIAL HYSTERECTOMY  1997  . TUBAL LIGATION  1985    SOCIAL HISTORY: Social History   Tobacco Use  . Smoking status: Never Smoker  . Smokeless tobacco: Never Used  Substance Use Topics  . Alcohol use: Not on file  . Drug use: Not on file    FAMILY HISTORY: Family History  Problem Relation Age of Onset  . Hyperlipidemia Mother   . Heart disease Mother   . Stroke Mother   . Alcoholism Mother     ROS: Review of Systems  Constitutional: Positive for weight loss.  Cardiovascular: Negative for chest pain.       Negative chest pressure  Neurological: Negative for headaches.  Endo/Heme/Allergies:       Negative hypoglycemia    PHYSICAL EXAM: Blood pressure (!) 143/75, pulse 68, temperature 97.9 F (36.6 C), temperature source Oral, height '5\' 3"'  (1.6 m), weight 239 lb (108.4 kg), SpO2 94 %. Body mass index is 42.34 kg/m. Physical Exam Vitals signs reviewed.  Constitutional:      Appearance: Normal appearance. She is obese.  Cardiovascular:     Rate and Rhythm: Normal rate.     Pulses: Normal pulses.  Pulmonary:     Effort: Pulmonary effort is normal.      Breath sounds: Normal breath sounds.  Musculoskeletal: Normal range of motion.  Skin:    General: Skin is warm and dry.  Neurological:     Mental Status: She is alert and oriented to person, place, and time.  Psychiatric:        Mood and Affect: Mood normal.        Behavior: Behavior normal.     RECENT LABS AND TESTS: BMET    Component Value Date/Time   NA 138 01/12/2019 1438   K 4.9 01/12/2019 1438   CL 99 01/12/2019 1438   CO2 22 01/12/2019 1438   GLUCOSE 103 (H) 01/12/2019 1438   BUN 16 01/12/2019 1438   CREATININE 0.72 01/12/2019 1438   CALCIUM 9.8 01/12/2019 1438   GFRNONAA 88 01/12/2019 1438   GFRAA 102 01/12/2019 1438   No results found for: HGBA1C Lab Results  Component Value Date   INSULIN 21.3 01/12/2019   CBC No results found for: WBC, RBC, HGB, HCT, PLT, MCV, MCH, MCHC, RDW, LYMPHSABS, MONOABS, EOSABS, BASOSABS Iron/TIBC/Ferritin/ %Sat No results found for: IRON, TIBC, FERRITIN, IRONPCTSAT Lipid Panel  No results found for: CHOL, TRIG, HDL, CHOLHDL, VLDL, LDLCALC, LDLDIRECT Hepatic Function Panel     Component Value Date/Time   PROT 7.0 01/12/2019 1438   ALBUMIN 4.2 01/12/2019 1438   AST 67 (H) 01/12/2019 1438   ALT 76 (H) 01/12/2019 1438   ALKPHOS 131 (H) 01/12/2019 1438   BILITOT 0.3 01/12/2019 1438      Component Value Date/Time   TSH 2.930 01/12/2019 1438      OBESITY BEHAVIORAL INTERVENTION VISIT  Today's visit was # 3   Starting weight: 251 lbs Starting date: 01/12/2019 Today's weight : 239 lbs  Today's date: 02/09/2019 Total lbs lost to date: 12 At least 15 minutes were spent on discussing the following behavioral intervention visit.   ASK: We discussed the diagnosis of obesity with Sydney Leach today and Sydney Leach agreed to give [place] permission to discuss obesity behavioral modification therapy today.  ASSESS: Sydney Leach has the diagnosis of obesity and her BMI today is 42.35 Sydney Leach is in the action stage of change   ADVISE:  Sydney Leach was  educated on the multiple health risks of obesity as well as the benefit of weight loss to improve her health. She was advised of the need for long term treatment and the importance of lifestyle modifications to improve her current health and to decrease her risk of future health problems.  AGREE: Multiple dietary modification options and treatment options were discussed and  Sydney Leach agreed to follow the recommendations documented in the above note.  ARRANGE: Sydney Leach was educated on the importance of frequent visits to treat obesity as outlined per CMS and USPSTF guidelines and agreed to schedule her next follow up appointment today.  I, Trixie Dredge, am acting as transcriptionist for Ilene Qua, MD  I have reviewed the above documentation for accuracy and completeness, and I agree with the above. - Ilene Qua, MD

## 2019-03-01 ENCOUNTER — Ambulatory Visit (INDEPENDENT_AMBULATORY_CARE_PROVIDER_SITE_OTHER): Payer: BC Managed Care – PPO | Admitting: Psychology

## 2019-03-01 DIAGNOSIS — L821 Other seborrheic keratosis: Secondary | ICD-10-CM | POA: Diagnosis not present

## 2019-03-01 DIAGNOSIS — D1801 Hemangioma of skin and subcutaneous tissue: Secondary | ICD-10-CM | POA: Diagnosis not present

## 2019-03-01 DIAGNOSIS — Z85828 Personal history of other malignant neoplasm of skin: Secondary | ICD-10-CM | POA: Diagnosis not present

## 2019-03-01 DIAGNOSIS — L814 Other melanin hyperpigmentation: Secondary | ICD-10-CM | POA: Diagnosis not present

## 2019-03-01 DIAGNOSIS — D225 Melanocytic nevi of trunk: Secondary | ICD-10-CM | POA: Diagnosis not present

## 2019-03-01 NOTE — Progress Notes (Unsigned)
Office: [phone]  /  Fax: [phone]    Date: March 07, 2019   Appointment Start Time:*** Duration:*** Provider: Glennie Isle, Psy.D. Type of Session: Individual Therapy  Location of Patient: *** Location of Provider: {Location of Service:22491} Type of Contact: Telepsychological Visit via [employer] Web[employer]   Session Content: Sydney Leach is a 63 y.o. female presenting via [place] for a follow-up appointment to address the previously established treatment goal of decreasing emotional eating. Today's appointment was a telepsychological visit, as this provider's clinic is seeing a limited number of patients for in-person visits due to COVID-19. Therapeutic services will resume to in-person appointments once deemed appropriate. Ximenna expressed understanding regarding the rationale for telepsychological services, and provided verbal consent for today's appointment. Prior to proceeding with today's appointment, Chloeann's physical location at the time of this appointment was obtained. Jeremiah reported she was at *** and provided the address. In the event of technical difficulties, Casara shared a phone number she could be reached at. [place] and this provider participated in today's telepsychological service. Also, Markea denied anyone else being present in the room or on the WebEx appointment ***.  This provider conducted a brief check-in and verbally administered the PHQ-9 and GAD-7. *** Tesla was receptive to today's session as evidenced by openness to sharing, responsiveness to feedback, and ***.  Mental Status Examination:  Appearance: {Appearance:22431} Behavior: {Behavior:22445} Mood: {Teletherapy mood:22435} Affect: {Affect:22436} Speech: {Speech:22432} Eye Contact: {Eye Contact:22433} Psychomotor Activity: {Motor Activity:22434} Thought Process: {thought process:22448}  Content/Perceptual Disturbances: {disturbances:22451} Orientation: {Orientation:22437}  Cognition/Sensorium: {gbcognition:22449} Insight: {Insight:22446} Judgment: {Insight:22446}  Structured Assessment Results: The Patient Health Questionnaire-9 (PHQ-9) is a self-report measure that assesses symptoms and severity of depression over the course of the last two weeks. Sharone obtained a score of *** suggesting {GBPHQ9SEVERITY:21752}. Labria finds the endorsed symptoms to be {gbphq9difficulty:21754}. Little interest or pleasure in doing things ***  Feeling down, depressed, or hopeless ***  Trouble falling or staying asleep, or sleeping too much ***  Feeling tired or having little energy ***  Poor appetite or overeating ***  Feeling bad about yourself --- or that you are a failure or have let yourself or your family down ***  Trouble concentrating on things, such as reading the newspaper or watching television ***  Moving or speaking so slowly that other people could have noticed? Or the opposite --- being so fidgety or restless that you have been moving around a lot more than usual ***  Thoughts that you would be better off dead or hurting yourself in some way ***  PHQ-9 Score ***    The Generalized Anxiety Disorder-7 (GAD-7) is a brief self-report measure that assesses symptoms of anxiety over the course of the last two weeks. [employer] obtained a score of *** suggesting {gbgad7severity:21753}. Kambreigh finds the endorsed symptoms to be {gbphq9difficulty:21754}. Feeling nervous, anxious, on edge ***  Not being able to stop or control worrying ***  Worrying too much about different things ***  Trouble relaxing ***  Being so restless that it's hard to sit still ***  Becoming easily annoyed or irritable ***  Feeling afraid as if something awful might happen ***  GAD-7 Score ***   Interventions:  {Interventions:22172}  DSM-5 Diagnosis: 311 (F32.8) Other Specified Depressive Disorder, Emotional Eating Behaviors  Treatment Goal & Progress: During the initial appointment with this  provider, the following treatment goal was established: decrease emotional eating. Serina has demonstrated progress in her goal as evidenced by {gbtxprogress:22839}. Indira also reported a reduction in emotional eating. ***  Plan: Shandricka continues to appear able and willing to participate as evidenced by engagement in reciprocal conversation, and asking questions for clarification as appropriate. The next appointment will be scheduled in {gbweeks:21758}, which will be via [employer]. The next session will focus on reviewing learned skills, and working towards the established treatment goal.***

## 2019-03-02 ENCOUNTER — Ambulatory Visit (INDEPENDENT_AMBULATORY_CARE_PROVIDER_SITE_OTHER): Payer: PPO | Admitting: Family Medicine

## 2019-03-02 ENCOUNTER — Other Ambulatory Visit: Payer: Self-pay

## 2019-03-02 VITALS — BP 101/65 | HR 72 | Temp 97.7°F | Ht 63.0 in | Wt 243.4 lb

## 2019-03-02 DIAGNOSIS — I10 Essential (primary) hypertension: Secondary | ICD-10-CM | POA: Diagnosis not present

## 2019-03-02 DIAGNOSIS — E119 Type 2 diabetes mellitus without complications: Secondary | ICD-10-CM | POA: Diagnosis not present

## 2019-03-02 DIAGNOSIS — Z6841 Body Mass Index (BMI) 40.0 and over, adult: Secondary | ICD-10-CM | POA: Diagnosis not present

## 2019-03-02 MED ORDER — FREESTYLE TEST VI STRP: ORAL_STRIP | 0 refills | Status: DC

## 2019-03-02 MED ORDER — FREESTYLE LANCETS MISC: 0 refills | Status: AC

## 2019-03-02 MED ORDER — BLOOD GLUCOSE METER KIT: PACK | 0 refills | Status: AC

## 2019-03-03 NOTE — Progress Notes (Signed)
Office: [phone]  /  Fax: [phone]   HPI:   Chief Complaint: OBESITY Sydney Leach is here to discuss her progress with her obesity treatment plan. She is on the Category 2 plan and is following her eating plan approximately 90 % of the time. She states she is exercising 0 minutes 0 times per week. Sydney Leach is frustrated with the lack of weight loss. Her water weight is up 5 to 6 pounds. She reports being "miserable", due to hunger. Sydney Leach is snacking on raw vegetables. She reports the only reason she is here is to be able to qualify for left total hip replacement. Her BMI needs to be 40 or below.  Her weight is 243 lb 6.4 oz (110.4 kg) today and has had a weight gain of 4 pounds over a period of 3 weeks since her last visit. She has lost 8 lbs since starting treatment with [place].  Diabetes II Sydney Leach has a diagnosis of diabetes type II. Sydney Leach is on [place]. Her PCP does not want her to switch to [place]. Last A1c was at 6.7 in August 2020 (not in [place]). Her PCP is with [facility].  She has been working on intensive lifestyle modifications including diet, exercise, and weight loss to help control her blood glucose levels.  Hypertension Sydney Leach is a 63 y.o. female with hypertension. Sydney Leach denies chest pain or shortness of breath on exertion. She is working weight loss to help control her blood pressure with the goal of decreasing her risk of heart attack and stroke. Sydney Leach blood pressure is well controlled. BP Readings from Last 3 Encounters:  03/02/19 101/65  02/09/19 (!) 143/75  01/26/19 120/69     ASSESSMENT AND PLAN:  Type 2 diabetes mellitus with hyperglycemia, without long-term current use of insulin (HCC) - Plan: Lancets (FREESTYLE) lancets, glucose blood (FREESTYLE TEST STRIPS) test strip  Essential hypertension  Class 3 severe obesity with serious comorbidity and body mass index (BMI) of 40.0 to 44.9 in adult, unspecified obesity type ([place])  PLAN:   Diabetes II Sydney Leach has been given extensive diabetes education by myself today including ideal fasting and post-prandial blood glucose readings, individual ideal Hgb A1c goals and hypoglycemia prevention. We discussed the importance of good blood sugar control to decrease the likelihood of diabetic complications such as nephropathy, neuropathy, limb loss, blindness, coronary artery disease, and death. We discussed the importance of intensive lifestyle modification including diet, exercise and weight loss as the first line treatment for diabetes. [place] agrees to continue her diabetes medications and prescription was written today for strips and lancets. Sydney Leach agreed to follow up with our clinic in 2 weeks.  Hypertension We discussed sodium restriction, working on healthy weight loss, and a regular exercise program as the means to achieve improved blood pressure control. Sydney Leach agreed with this plan and agreed to follow up as directed. We will continue to monitor her blood pressure as well as her progress with the above lifestyle modifications. She will continue all medications as prescribed and will watch for signs of hypotension as she continues her lifestyle modifications.  Obesity Sydney Leach is currently in the action stage of change. As such, her goal is to continue with weight loss efforts She has agreed to follow the Category 2 plan +100 calories. We will add 100 calories to help with hunger.  We discussed the following Behavioral Modification Strategies today: planning for success, increase H2O intake and increasing lean protein intake  Sydney Leach has agreed to follow up with  our clinic in 2 weeks. She was informed of the importance of frequent follow up visits to maximize her success with intensive lifestyle modifications for her multiple health conditions.  ALLERGIES: Allergies  Allergen Reactions  . Clindamycin/Lincomycin   . Erythromycin   . Sulfa Antibiotics     MEDICATIONS:  Current Outpatient Medications on File Prior to Visit  Medication Sig Dispense Refill  . azelastine (ASTELIN) 0.1 % nasal spray Place 2 sprays into both nostrils 2 (two) times daily. Use in each nostril as directed    . cetirizine (ZYRTEC) 10 MG tablet Take 10 mg by mouth daily.    Marland Kitchen estradiol (ESTRACE) 0.5 MG tablet Take 0.5 mg by mouth every other day.    . fluticasone (FLONASE) 50 MCG/ACT nasal spray Place 2 sprays into both nostrils daily.    Marland Kitchen glucose monitoring kit (FREESTYLE) monitoring kit 1 each by Does not apply route as needed for other. Test twice daily 1 each 0  . lisinopril (ZESTRIL) 20 MG tablet Take 20 mg by mouth daily.    . Loperamide HCl (IMODIUM A-D PO) Take by mouth 1 day or 1 dose.    . meloxicam (MOBIC) 15 MG tablet Take 15 mg by mouth daily.    . montelukast (SINGULAIR) 10 MG tablet Take 10 mg by mouth as needed.    . Omega-3 Fatty Acids (FISH OIL) 1000 MG CAPS Take 1,000 mg by mouth.    Marland Kitchen omeprazole (PRILOSEC) 40 MG capsule Take 40 mg by mouth daily.    . pregabalin (LYRICA) 150 MG capsule Take 150 mg by mouth daily.     . sitaGLIPtin-metformin (JANUMET) 50-1000 MG tablet Take 1 tablet by mouth 2 (two) times daily with a meal.    . spironolactone (ALDACTONE) 25 MG tablet Take 25 mg by mouth daily.    . Vitamin D, Ergocalciferol, (DRISDOL) 1.25 MG (50000 UT) CAPS capsule Take 50,000 Units by mouth every 7 (seven) days.    . traMADol (ULTRAM) 50 MG tablet Take 50 mg by mouth 3 (three) times daily. 1-2 TID     No current facility-administered medications on file prior to visit.     PAST MEDICAL HISTORY: Past Medical History:  Diagnosis Date  . Bilateral swelling of feet   . Bright's disease   . Chronic fatigue syndrome   . Chronic pain   . Chronic stasis dermatitis   . Constipation   . Degenerative arthritis   . Edema   . Fatty liver   . Fibromyalgia   . GERD (gastroesophageal reflux disease)   . History of colon polyps   . Hypertension   . IBS (irritable  bowel syndrome)   . Joint pain   . Kidney problem   . Lumbar disc narrowing   . Morbid obesity ([place])   . Osteoarthritis   . Plantar fasciitis   . Skin cancer   . Sleep apnea   . Swallowing difficulty   . Type 2 diabetes mellitus ([place])   . Ventricular septal defect   . Vitamin D deficiency     PAST SURGICAL HISTORY: Past Surgical History:  Procedure Laterality Date  . [place]  . MICRODISCECTOMY LUMBAR  2017  . PARTIAL HYSTERECTOMY  1997  . TUBAL LIGATION  1985    SOCIAL HISTORY: Social History   Tobacco Use  . Smoking status: Never Smoker  . Smokeless tobacco: Never Used  Substance Use Topics  . Alcohol use: Not on file  . Drug use: Not on  file    FAMILY HISTORY: Family History  Problem Relation Age of Onset  . Hyperlipidemia Mother   . Heart disease Mother   . Stroke Mother   . Alcoholism Mother     ROS: Review of Systems  Constitutional: Negative for weight loss.  Respiratory: Negative for shortness of breath (on exertion).   Cardiovascular: Negative for chest pain.    PHYSICAL EXAM: Blood pressure 101/65, pulse 72, temperature 97.7 F (36.5 C), temperature source Oral, height _0  (1.6 m), weight 243 lb 6.4 oz (110.4 kg), SpO2 97 %. Body mass index is 43.12 kg/m. Physical Exam Vitals signs reviewed.  Constitutional:      Appearance: Normal appearance. She is well-developed. She is obese.  Cardiovascular:     Rate and Rhythm: Normal rate.  Pulmonary:     Effort: Pulmonary effort is normal.  Musculoskeletal: Normal range of motion.  Skin:    General: Skin is warm and dry.  Neurological:     Mental Status: She is alert and oriented to person, place, and time.  Psychiatric:        Mood and Affect: Mood normal.        Behavior: Behavior normal.     RECENT LABS AND TESTS: BMET    Component Value Date/Time   NA 138 01/12/2019 1438   K 4.9 01/12/2019 1438   CL 99 01/12/2019 1438   CO2 22 01/12/2019 1438   GLUCOSE 103 (H)  01/12/2019 1438   BUN 16 01/12/2019 1438   CREATININE 0.72 01/12/2019 1438   CALCIUM 9.8 01/12/2019 1438   GFRNONAA 88 01/12/2019 1438   GFRAA 102 01/12/2019 1438   No results found for: HGBA1C Lab Results  Component Value Date   INSULIN 21.3 01/12/2019   CBC No results found for: WBC, RBC, HGB, HCT, PLT, MCV, MCH, MCHC, RDW, LYMPHSABS, MONOABS, EOSABS, BASOSABS Iron/TIBC/Ferritin/ %Sat No results found for: IRON, TIBC, FERRITIN, IRONPCTSAT Lipid Panel  No results found for: CHOL, TRIG, HDL, CHOLHDL, VLDL, LDLCALC, LDLDIRECT Hepatic Function Panel     Component Value Date/Time   PROT 7.0 01/12/2019 1438   ALBUMIN 4.2 01/12/2019 1438   AST 67 (H) 01/12/2019 1438   ALT 76 (H) 01/12/2019 1438   ALKPHOS 131 (H) 01/12/2019 1438   BILITOT 0.3 01/12/2019 1438      Component Value Date/Time   TSH 2.930 01/12/2019 1438     Ref. Range 01/12/2019 14:38  Vitamin D, 25-Hydroxy Latest Ref Range: 30.0 - 100.0 ng/mL 68.9    OBESITY BEHAVIORAL INTERVENTION VISIT  Today's visit was # 4   Starting weight: 251 lbs Starting date: 01/12/2019 Today's weight : 243 lbs Today's date: 03/02/2019 Total lbs lost to date: 8    03/02/2019  Height _1  (1.6 m)  Weight 243 lb 6.4 oz (110.4 kg)  BMI (Calculated) 43.13  BLOOD PRESSURE - SYSTOLIC 400  BLOOD PRESSURE - DIASTOLIC 65   Body Fat % 86.7 %  Total Body Water (lbs) 89.2 lbs    ASK: We discussed the diagnosis of obesity with Sydney Leach today and Sydney Leach agreed to give [place] permission to discuss obesity behavioral modification therapy today.  ASSESS: Sydney Leach has the diagnosis of obesity and her BMI today is 43.13 Sydney Leach is in the action stage of change   ADVISE: Sydney Leach was educated on the multiple health risks of obesity as well as the benefit of weight loss to improve her health. She was advised of the need for long term treatment and the importance  of lifestyle modifications to improve her current health and to decrease her  risk of future health problems.  AGREE: Multiple dietary modification options and treatment options were discussed and  Sydney Leach agreed to follow the recommendations documented in the above note.  ARRANGE: Sydney Leach was educated on the importance of frequent visits to treat obesity as outlined per CMS and USPSTF guidelines and agreed to schedule her next follow up appointment today.  I, Doreene Nest, am acting as transcriptionist for [employer], FNP-C  I have reviewed the above documentation for accuracy and completeness, and I agree with the above.  - Dawn Whitmire, FNP-C.

## 2019-03-07 ENCOUNTER — Ambulatory Visit (INDEPENDENT_AMBULATORY_CARE_PROVIDER_SITE_OTHER): Payer: PPO | Admitting: Psychology

## 2019-03-07 ENCOUNTER — Encounter (INDEPENDENT_AMBULATORY_CARE_PROVIDER_SITE_OTHER): Payer: Self-pay | Admitting: Family Medicine

## 2019-03-07 DIAGNOSIS — I10 Essential (primary) hypertension: Secondary | ICD-10-CM | POA: Insufficient documentation

## 2019-03-07 DIAGNOSIS — E119 Type 2 diabetes mellitus without complications: Secondary | ICD-10-CM | POA: Insufficient documentation

## 2019-03-21 ENCOUNTER — Other Ambulatory Visit: Payer: Self-pay

## 2019-03-21 ENCOUNTER — Ambulatory Visit (INDEPENDENT_AMBULATORY_CARE_PROVIDER_SITE_OTHER): Payer: PPO | Admitting: Family Medicine

## 2019-03-21 VITALS — BP 101/60 | HR 69 | Temp 97.9°F | Ht 63.0 in | Wt 236.0 lb

## 2019-03-21 DIAGNOSIS — E559 Vitamin D deficiency, unspecified: Secondary | ICD-10-CM | POA: Diagnosis not present

## 2019-03-21 DIAGNOSIS — E1165 Type 2 diabetes mellitus with hyperglycemia: Secondary | ICD-10-CM

## 2019-03-21 DIAGNOSIS — Z6841 Body Mass Index (BMI) 40.0 and over, adult: Secondary | ICD-10-CM

## 2019-03-21 DIAGNOSIS — Z9189 Other specified personal risk factors, not elsewhere classified: Secondary | ICD-10-CM

## 2019-03-22 DIAGNOSIS — G4733 Obstructive sleep apnea (adult) (pediatric): Secondary | ICD-10-CM | POA: Diagnosis not present

## 2019-03-22 MED ORDER — VITAMIN D (ERGOCALCIFEROL) 1.25 MG (50000 UNIT) PO CAPS: 50000.0000 [IU] | ORAL_CAPSULE | ORAL | 0 refills | Status: DC

## 2019-03-22 NOTE — Progress Notes (Signed)
Office: [phone]  /  Fax: [phone]   HPI:   Chief Complaint: OBESITY Sydney Leach is here to discuss her progress with her obesity treatment plan. She is on the Category 2 plan + 100 calories and is following her eating plan approximately 85 % of the time. She states she is taking care of puppies and doing housework. Sydney Leach is satisfied with weight loss. Her hunger is worst from 2-5 pm, pretty much everyday. She voices though that her hunger really is never controlled. She is getting 8 oz of meat at night. She is doing cereal options, and doing large salad with 4 oz of meat on the salad.  Her weight is 236 lb (107 kg) today and has had a weight loss of 7 pounds over a period of 3 weeks since her last visit. She has lost 15 lbs since starting treatment with [place].  Vitamin D Deficiency Sydney Leach has a diagnosis of vitamin D deficiency. She is currently taking prescription Vit D. She notes fatigue and denies nausea, vomiting or muscle weakness.  At risk for osteopenia and osteoporosis Sydney Leach is at higher risk of osteopenia and osteoporosis due to vitamin D deficiency.   Diabetes II with Hyperglycemia Sydney Leach has a diagnosis of diabetes type II. Sydney Leach on Janumet BID. Last Hgb A1c 6.4 per patient. She notes occasional feelings of hypoglycemia especially in the afternoons. She has been working on intensive lifestyle modifications including diet, exercise, and weight loss to help control her blood glucose levels.  ASSESSMENT AND PLAN:  Type 2 diabetes mellitus with hyperglycemia, without long-term current use of insulin (HCC)  Vitamin D deficiency - Plan: Vitamin D, Ergocalciferol, (DRISDOL) 1.25 MG (50000 UT) CAPS capsule  At risk for osteoporosis  Class 3 severe obesity with serious comorbidity and body mass index (BMI) of 40.0 to 44.9 in adult, unspecified obesity type ([name])  PLAN:  Vitamin D Deficiency Sydney Leach was informed that low vitamin D levels contributes to fatigue and are  associated with obesity, breast, and colon cancer. Sydney Leach agrees to continue taking prescription Vit D 50,000 IU every week #4 and we will refill for 1 month. She will follow up for routine testing of vitamin D, at least 2-3 times per year. She was informed of the risk of over-replacement of vitamin D and agrees to not increase her dose unless she discusses this with [place] first. Sydney Leach agrees to follow up with our clinic in 2 weeks.  At risk for osteopenia and osteoporosis Sydney Leach was given extended (15 minutes) osteoporosis prevention counseling today. Sydney Leach is at risk for osteopenia and osteoporsis due to her vitamin D deficiency. She was encouraged to take her vitamin D and follow her higher calcium diet and increase strengthening exercise to help strengthen her bones and decrease her risk of osteopenia and osteoporosis.  Diabetes II with Hyperglycemia Sydney Leach has been given extensive diabetes education by myself today including ideal fasting and post-prandial blood glucose readings, individual ideal Hgb A1c goals and hypoglycemia prevention. We discussed the importance of good blood sugar control to decrease the likelihood of diabetic complications such as nephropathy, neuropathy, limb loss, blindness, coronary artery disease, and death. We discussed the importance of intensive lifestyle modification including diet, exercise and weight loss as the first line treatment for diabetes. Sydney Leach agrees to decrease Janumet XR to daily, and we will follow up on blood sugar control at her next appointment. Sydney Leach agrees to follow up with our clinic in 2 weeks.  Obesity Sydney Leach is currently in the action stage  of change. As such, her goal is to continue with weight loss efforts She has agreed to follow the Category 2 plan + 100 calories with lunch on Category 3 Sydney Leach has been instructed to work up to a goal of 150 minutes of combined cardio and strengthening exercise per week for weight loss and overall  health benefits. We discussed the following Behavioral Modification Strategies today: increasing lean protein intake, increasing vegetables and work on meal planning and easy cooking plans, keeping healthy foods in the home, and planning for success   Sydney Leach has agreed to follow up with our clinic in 2 weeks. She was informed of the importance of frequent follow up visits to maximize her success with intensive lifestyle modifications for her multiple health conditions.  ALLERGIES: Allergies  Allergen Reactions  . Clindamycin/Lincomycin   . Erythromycin   . Sulfa Antibiotics     MEDICATIONS: Current Outpatient Medications on File Prior to Visit  Medication Sig Dispense Refill  . azelastine (ASTELIN) 0.1 % nasal spray Place 2 sprays into both nostrils 2 (two) times daily. Use in each nostril as directed    . blood glucose meter kit and supplies Dispense based on patient and insurance preference. Use up to four times daily as directed. (FOR ICD-10 E10.9, E11.9). 1 each 0  . cetirizine (ZYRTEC) 10 MG tablet Take 10 mg by mouth daily.    Marland Kitchen estradiol (ESTRACE) 0.5 MG tablet Take 0.5 mg by mouth every other day.    . fluticasone (FLONASE) 50 MCG/ACT nasal spray Place 2 sprays into both nostrils daily.    Marland Kitchen glucose blood (FREESTYLE TEST STRIPS) test strip Use as instructed 100 each 0  . glucose monitoring kit (FREESTYLE) monitoring kit 1 each by Does not apply route as needed for other. Test twice daily 1 each 0  . Lancets (FREESTYLE) lancets Use as instructed 100 each 0  . lisinopril (ZESTRIL) 20 MG tablet Take 20 mg by mouth daily.    . Loperamide HCl (IMODIUM A-D PO) Take by mouth 1 day or 1 dose.    . meloxicam (MOBIC) 15 MG tablet Take 15 mg by mouth daily.    . montelukast (SINGULAIR) 10 MG tablet Take 10 mg by mouth as needed.    . Omega-3 Fatty Acids (FISH OIL) 1000 MG CAPS Take 1,000 mg by mouth.    Marland Kitchen omeprazole (PRILOSEC) 40 MG capsule Take 40 mg by mouth daily.    . pregabalin  (LYRICA) 150 MG capsule Take 150 mg by mouth daily.     . sitaGLIPtin-metformin (JANUMET) 50-1000 MG tablet Take 1 tablet by mouth 2 (two) times daily with a meal.    . spironolactone (ALDACTONE) 25 MG tablet Take 25 mg by mouth daily. 1/2 tab qd per pt    . traMADol (ULTRAM) 50 MG tablet Take 50 mg by mouth 3 (three) times daily. 1-2 TID    . Vitamin D, Ergocalciferol, (DRISDOL) 1.25 MG (50000 UT) CAPS capsule Take 50,000 Units by mouth every 7 (seven) days.     No current facility-administered medications on file prior to visit.     PAST MEDICAL HISTORY: Past Medical History:  Diagnosis Date  . Bilateral swelling of feet   . Bright's disease   . Chronic fatigue syndrome   . Chronic pain   . Chronic stasis dermatitis   . Constipation   . Degenerative arthritis   . Edema   . Fatty liver   . Fibromyalgia   . GERD (gastroesophageal reflux disease)   .  History of colon polyps   . Hypertension   . IBS (irritable bowel syndrome)   . Joint pain   . Kidney problem   . Lumbar disc narrowing   . Morbid obesity ([place])   . Osteoarthritis   . Plantar fasciitis   . Skin cancer   . Sleep apnea   . Swallowing difficulty   . Type 2 diabetes mellitus ([place])   . Ventricular septal defect   . Vitamin D deficiency     PAST SURGICAL HISTORY: Past Surgical History:  Procedure Laterality Date  . [place]  . MICRODISCECTOMY LUMBAR  2017  . PARTIAL HYSTERECTOMY  1997  . TUBAL LIGATION  1985    SOCIAL HISTORY: Social History   Tobacco Use  . Smoking status: Never Smoker  . Smokeless tobacco: Never Used  Substance Use Topics  . Alcohol use: Not on file  . Drug use: Not on file    FAMILY HISTORY: Family History  Problem Relation Age of Onset  . Hyperlipidemia Mother   . Heart disease Mother   . Stroke Mother   . Alcoholism Mother     ROS: Review of Systems  Constitutional: Positive for malaise/fatigue and weight loss.  Gastrointestinal: Negative for nausea  and vomiting.  Musculoskeletal:       Negative muscle weakness  Endo/Heme/Allergies:       Positive hypoglycemia    PHYSICAL EXAM: Blood pressure 101/60, pulse 69, temperature 97.9 F (36.6 C), temperature source Oral, height 5' 3" (1.6 m), weight 236 lb (107 kg), SpO2 96 %. Body mass index is 41.81 kg/m. Physical Exam Vitals signs reviewed.  Constitutional:      Appearance: Normal appearance. She is obese.  Cardiovascular:     Rate and Rhythm: Normal rate.     Pulses: Normal pulses.  Pulmonary:     Effort: Pulmonary effort is normal.     Breath sounds: Normal breath sounds.  Musculoskeletal: Normal range of motion.  Skin:    General: Skin is warm and dry.  Neurological:     Mental Status: She is alert and oriented to person, place, and time.  Psychiatric:        Mood and Affect: Mood normal.        Behavior: Behavior normal.     RECENT LABS AND TESTS: BMET    Component Value Date/Time   NA 138 01/12/2019 1438   K 4.9 01/12/2019 1438   CL 99 01/12/2019 1438   CO2 22 01/12/2019 1438   GLUCOSE 103 (H) 01/12/2019 1438   BUN 16 01/12/2019 1438   CREATININE 0.72 01/12/2019 1438   CALCIUM 9.8 01/12/2019 1438   GFRNONAA 88 01/12/2019 1438   GFRAA 102 01/12/2019 1438   No results found for: HGBA1C Lab Results  Component Value Date   INSULIN 21.3 01/12/2019   CBC No results found for: WBC, RBC, HGB, HCT, PLT, MCV, MCH, MCHC, RDW, LYMPHSABS, MONOABS, EOSABS, BASOSABS Iron/TIBC/Ferritin/ %Sat No results found for: IRON, TIBC, FERRITIN, IRONPCTSAT Lipid Panel  No results found for: CHOL, TRIG, HDL, CHOLHDL, VLDL, LDLCALC, LDLDIRECT Hepatic Function Panel     Component Value Date/Time   PROT 7.0 01/12/2019 1438   ALBUMIN 4.2 01/12/2019 1438   AST 67 (H) 01/12/2019 1438   ALT 76 (H) 01/12/2019 1438   ALKPHOS 131 (H) 01/12/2019 1438   BILITOT 0.3 01/12/2019 1438      Component Value Date/Time   TSH 2.930 01/12/2019 1438      OBESITY BEHAVIORAL INTERVENTION  VISIT  Today's visit was # 5   Starting weight: 251 lbs Starting date: 01/12/2019 Today's weight : 236 lbs  Today's date: 03/21/2019 Total lbs lost to date: 64    ASK: We discussed the diagnosis of obesity with Karen Kays today and Rhayne agreed to give [place] permission to discuss obesity behavioral modification therapy today.  ASSESS: Tametria has the diagnosis of obesity and her BMI today is 41.82 Danai is in the action stage of change   ADVISE: Saarah was educated on the multiple health risks of obesity as well as the benefit of weight loss to improve her health. She was advised of the need for long term treatment and the importance of lifestyle modifications to improve her current health and to decrease her risk of future health problems.  AGREE: Multiple dietary modification options and treatment options were discussed and  Valery agreed to follow the recommendations documented in the above note.  ARRANGE: [place] was educated on the importance of frequent visits to treat obesity as outlined per CMS and USPSTF guidelines and agreed to schedule her next follow up appointment today.  I, Trixie Dredge, am acting as transcriptionist for Ilene Qua, MD  I have reviewed the above documentation for accuracy and completeness, and I agree with the above. - Ilene Qua, MD

## 2019-03-28 ENCOUNTER — Telehealth (INDEPENDENT_AMBULATORY_CARE_PROVIDER_SITE_OTHER): Payer: Self-pay | Admitting: Family Medicine

## 2019-03-28 ENCOUNTER — Encounter (INDEPENDENT_AMBULATORY_CARE_PROVIDER_SITE_OTHER): Payer: Self-pay

## 2019-03-28 NOTE — Telephone Encounter (Signed)
Sent pt a mychart message. 

## 2019-03-28 NOTE — Telephone Encounter (Signed)
Pt is upset because Vit D was called in for a 30 day supply.  This cost her to much to get this way instead of a 90 day supply.  She feels we have "screwed her over".  She gets her Vit D from her primary care and no [place].  She doesn't know why we called it in.

## 2019-04-06 ENCOUNTER — Other Ambulatory Visit: Payer: Self-pay

## 2019-04-06 ENCOUNTER — Ambulatory Visit (INDEPENDENT_AMBULATORY_CARE_PROVIDER_SITE_OTHER): Payer: PPO | Admitting: Family Medicine

## 2019-04-06 VITALS — BP 123/70 | HR 64 | Temp 97.9°F | Ht 63.0 in | Wt 237.0 lb

## 2019-04-06 DIAGNOSIS — Z6841 Body Mass Index (BMI) 40.0 and over, adult: Secondary | ICD-10-CM | POA: Diagnosis not present

## 2019-04-06 DIAGNOSIS — E1165 Type 2 diabetes mellitus with hyperglycemia: Secondary | ICD-10-CM

## 2019-04-06 DIAGNOSIS — E559 Vitamin D deficiency, unspecified: Secondary | ICD-10-CM | POA: Diagnosis not present

## 2019-04-07 NOTE — Progress Notes (Signed)
Office: [phone]  /  Fax: [phone]   HPI:   Chief Complaint: OBESITY Sydney Leach is here to discuss her progress with her obesity treatment plan. She is on the Category 2 plan + 100 calories with Category 3 for lunch and is following her eating plan approximately 75 % of the time. She states she is exercising 0 minutes 0 times per week. Sydney Leach says she is getting more nervous about possible surgery for joint replacement. She reports hunger pretty much most of the day. She does Quarry manager and Vanilla wafers for snacks. She does acknowledge that she is feeling she is self sabotaging. She hates the bread and ilk on the plan.  Her weight is 237 lb (107.5 kg) today and has gained 1 lb since her last visit. She has lost 14 lbs since starting treatment with [place].  Vitamin D Deficiency Sydney Leach has a diagnosis of vitamin D deficiency. She is currently taking prescription Vit D. She notes fatigue and denies nausea, vomiting or muscle weakness.  Diabetes II with Hyperglycemia Sydney Leach has a diagnosis of diabetes type II. Sydney Leach states she is not checking her blood sugars at home. She denies feelings of hypoglycemia. She has been working on intensive lifestyle modifications including diet, exercise, and weight loss to help control her blood glucose levels.  ASSESSMENT AND PLAN:  Vitamin D deficiency  Type 2 diabetes mellitus with hyperglycemia, without long-term current use of insulin (HCC)  Class 3 severe obesity with serious comorbidity and body mass index (BMI) of 40.0 to 44.9 in adult, unspecified obesity type ([place])  PLAN:  Vitamin D Deficiency Sydney Leach was informed that low vitamin D levels contributes to fatigue and are associated with obesity, breast, and colon cancer. She can stop Vit D prescription, and switch to multivitamin OTC. She will follow up for routine testing of vitamin D, at least 2-3 times per year. She was informed of the risk of over-replacement of vitamin D and agrees  to not increase her dose unless she discusses this with [place] first. Sydney Leach agrees to follow up with our clinic in 2 weeks.  Diabetes II with Hyperglycemia Sydney Leach has been given extensive diabetes education by myself today including ideal fasting and post-prandial blood glucose readings, individual ideal Hgb A1c goals and hypoglycemia prevention. We discussed the importance of good blood sugar control to decrease the likelihood of diabetic complications such as nephropathy, neuropathy, limb loss, blindness, coronary artery disease, and death. We discussed the importance of intensive lifestyle modification including diet, exercise and weight loss as the first line treatment for diabetes. Sydney Leach agrees to continue her diabetes medications, and we will repeat labs at her next appointment. Sydney Leach agrees to follow up with our clinic in 2 weeks.  Obesity Sydney Leach is currently in the action stage of change. As such, her goal is to continue with weight loss efforts She has agreed to keep a food journal with 1150-1300 calories and 80+ grams of protein daily or follow the Category 2 plan + 100 calories with Category 3 for lunch Sydney Leach has been instructed to work up to a goal of 150 minutes of combined cardio and strengthening exercise per week for weight loss and overall health benefits. We discussed the following Behavioral Modification Strategies today: increasing lean protein intake, increasing vegetables and work on meal planning and easy cooking plans, keeping healthy foods in the home, and planning for success   Sydney Leach has agreed to follow up with our clinic in 2 weeks. She was informed of the importance  of frequent follow up visits to maximize her success with intensive lifestyle modifications for her multiple health conditions.  ALLERGIES: Allergies  Allergen Reactions  . Clindamycin/Lincomycin   . Erythromycin   . Sulfa Antibiotics     MEDICATIONS: Current Outpatient Medications on File  Prior to Visit  Medication Sig Dispense Refill  . azelastine (ASTELIN) 0.1 % nasal spray Place 2 sprays into both nostrils 2 (two) times daily. Use in each nostril as directed    . blood glucose meter kit and supplies Dispense based on patient and insurance preference. Use up to four times daily as directed. (FOR ICD-10 E10.9, E11.9). 1 each 0  . cetirizine (ZYRTEC) 10 MG tablet Take 10 mg by mouth daily.    Marland Kitchen estradiol (ESTRACE) 0.5 MG tablet Take 0.5 mg by mouth every other day.    . fluticasone (FLONASE) 50 MCG/ACT nasal spray Place 2 sprays into both nostrils daily.    Marland Kitchen glucose blood (FREESTYLE TEST STRIPS) test strip Use as instructed 100 each 0  . glucose monitoring kit (FREESTYLE) monitoring kit 1 each by Does not apply route as needed for other. Test twice daily 1 each 0  . Lancets (FREESTYLE) lancets Use as instructed 100 each 0  . lisinopril (ZESTRIL) 20 MG tablet Take 20 mg by mouth daily.    . Loperamide HCl (IMODIUM A-D PO) Take by mouth 1 day or 1 dose.    . meloxicam (MOBIC) 15 MG tablet Take 15 mg by mouth daily.    . montelukast (SINGULAIR) 10 MG tablet Take 10 mg by mouth as needed.    . Omega-3 Fatty Acids (FISH OIL) 1000 MG CAPS Take 1,000 mg by mouth.    Marland Kitchen omeprazole (PRILOSEC) 40 MG capsule Take 40 mg by mouth daily.    . pregabalin (LYRICA) 150 MG capsule Take 150 mg by mouth daily.     . sitaGLIPtin-metformin (JANUMET) 50-1000 MG tablet Take 1 tablet by mouth 2 (two) times daily with a meal.    . spironolactone (ALDACTONE) 25 MG tablet Take 25 mg by mouth daily. 1/2 tab qd per pt    . traMADol (ULTRAM) 50 MG tablet Take 50 mg by mouth 3 (three) times daily. 1-2 TID     No current facility-administered medications on file prior to visit.     PAST MEDICAL HISTORY: Past Medical History:  Diagnosis Date  . Bilateral swelling of feet   . Bright's disease   . Chronic fatigue syndrome   . Chronic pain   . Chronic stasis dermatitis   . Constipation   . Degenerative  arthritis   . Edema   . Fatty liver   . Fibromyalgia   . GERD (gastroesophageal reflux disease)   . History of colon polyps   . Hypertension   . IBS (irritable bowel syndrome)   . Joint pain   . Kidney problem   . Lumbar disc narrowing   . Morbid obesity ([place])   . Osteoarthritis   . Plantar fasciitis   . Skin cancer   . Sleep apnea   . Swallowing difficulty   . Type 2 diabetes mellitus ([place])   . Ventricular septal defect   . Vitamin D deficiency     PAST SURGICAL HISTORY: Past Surgical History:  Procedure Laterality Date  . [place]  . MICRODISCECTOMY LUMBAR  2017  . PARTIAL HYSTERECTOMY  1997  . TUBAL LIGATION  1985    SOCIAL HISTORY: Social History   Tobacco Use  .  Smoking status: Never Smoker  . Smokeless tobacco: Never Used  Substance Use Topics  . Alcohol use: Not on file  . Drug use: Not on file    FAMILY HISTORY: Family History  Problem Relation Age of Onset  . Hyperlipidemia Mother   . Heart disease Mother   . Stroke Mother   . Alcoholism Mother     ROS: Review of Systems  Constitutional: Positive for malaise/fatigue. Negative for weight loss.  Gastrointestinal: Negative for nausea and vomiting.  Musculoskeletal:       Negative muscle weakness  Endo/Heme/Allergies:       Negative hypoglycemia    PHYSICAL EXAM: Blood pressure 123/70, pulse 64, temperature 97.9 F (36.6 C), temperature source Oral, height _0  (1.6 m), weight 237 lb (107.5 kg), SpO2 97 %. Body mass index is 41.98 kg/m. Physical Exam Vitals signs reviewed.  Constitutional:      Appearance: Normal appearance. She is obese.  Cardiovascular:     Rate and Rhythm: Normal rate.     Pulses: Normal pulses.  Pulmonary:     Effort: Pulmonary effort is normal.     Breath sounds: Normal breath sounds.  Musculoskeletal: Normal range of motion.  Skin:    General: Skin is warm and dry.  Neurological:     Mental Status: She is alert and oriented to person, place,  and time.  Psychiatric:        Mood and Affect: Mood normal.        Behavior: Behavior normal.     RECENT LABS AND TESTS: BMET    Component Value Date/Time   NA 138 01/12/2019 1438   K 4.9 01/12/2019 1438   CL 99 01/12/2019 1438   CO2 22 01/12/2019 1438   GLUCOSE 103 (H) 01/12/2019 1438   BUN 16 01/12/2019 1438   CREATININE 0.72 01/12/2019 1438   CALCIUM 9.8 01/12/2019 1438   GFRNONAA 88 01/12/2019 1438   GFRAA 102 01/12/2019 1438   No results found for: HGBA1C Lab Results  Component Value Date   INSULIN 21.3 01/12/2019   CBC No results found for: WBC, RBC, HGB, HCT, PLT, MCV, MCH, MCHC, RDW, LYMPHSABS, MONOABS, EOSABS, BASOSABS Iron/TIBC/Ferritin/ %Sat No results found for: IRON, TIBC, FERRITIN, IRONPCTSAT Lipid Panel  No results found for: CHOL, TRIG, HDL, CHOLHDL, VLDL, LDLCALC, LDLDIRECT Hepatic Function Panel     Component Value Date/Time   PROT 7.0 01/12/2019 1438   ALBUMIN 4.2 01/12/2019 1438   AST 67 (H) 01/12/2019 1438   ALT 76 (H) 01/12/2019 1438   ALKPHOS 131 (H) 01/12/2019 1438   BILITOT 0.3 01/12/2019 1438      Component Value Date/Time   TSH 2.930 01/12/2019 1438      OBESITY BEHAVIORAL INTERVENTION VISIT  Today's visit was # 6   Starting weight: 251 lbs Starting date: 01/12/2019 Today's weight : 237 lbs Today's date: 04/06/2019 Total lbs lost to date: 14 At least 15 minutes were spent on discussing the following behavioral intervention visit.   ASK: We discussed the diagnosis of obesity with Sydney Leach today and Sydney Leach agreed to give [place] permission to discuss obesity behavioral modification therapy today.  ASSESS: Sydney Leach has the diagnosis of obesity and her BMI today is 41.99 Sydney Leach is in the action stage of change   ADVISE: Sydney Leach was educated on the multiple health risks of obesity as well as the benefit of weight loss to improve her health. She was advised of the need for long term treatment and the importance of  lifestyle modifications to improve her current health and to decrease her risk of future health problems.  AGREE: Multiple dietary modification options and treatment options were discussed and  Sydney Leach agreed to follow the recommendations documented in the above note.  ARRANGE: Sydney Leach was educated on the importance of frequent visits to treat obesity as outlined per CMS and USPSTF guidelines and agreed to schedule her next follow up appointment today.  I, Trixie Dredge, am acting as transcriptionist for Ilene Qua, MD  I have reviewed the above documentation for accuracy and completeness, and I agree with the above. - Ilene Qua, MD

## 2019-04-21 ENCOUNTER — Other Ambulatory Visit: Payer: Self-pay

## 2019-04-21 ENCOUNTER — Encounter (INDEPENDENT_AMBULATORY_CARE_PROVIDER_SITE_OTHER): Payer: Self-pay | Admitting: Bariatrics

## 2019-04-21 ENCOUNTER — Ambulatory Visit (INDEPENDENT_AMBULATORY_CARE_PROVIDER_SITE_OTHER): Payer: PPO | Admitting: Bariatrics

## 2019-04-21 VITALS — BP 121/69 | HR 70 | Temp 98.7°F | Ht 63.0 in | Wt 233.0 lb

## 2019-04-21 DIAGNOSIS — Z6841 Body Mass Index (BMI) 40.0 and over, adult: Secondary | ICD-10-CM | POA: Diagnosis not present

## 2019-04-21 DIAGNOSIS — F3289 Other specified depressive episodes: Secondary | ICD-10-CM | POA: Diagnosis not present

## 2019-04-21 DIAGNOSIS — E1165 Type 2 diabetes mellitus with hyperglycemia: Secondary | ICD-10-CM | POA: Diagnosis not present

## 2019-04-21 NOTE — Progress Notes (Signed)
Office: [phone]  /  Fax: [phone]   HPI:   Chief Complaint: OBESITY Sydney Leach is here to discuss her progress with her obesity treatment plan. She is on the  follow the Category 2 plan and follow the Category 3 plan and is following her eating plan approximately 75 % of the time. She states she is exercising 0 minutes 0 times per week. Sydney Leach is doing well with the plan overall and lost 4 lbs this visit. She has typically seen Dr. Adair Patter in the past and this is her first visit with me. Sydney Leach  Her weight is 233 lb (105.7 kg) today and has had a weight loss of 4 pounds over a period of 2 weeks since her last visit. She has lost 18 lbs since starting treatment with [place].  Diabetes II Bambie has a diagnosis of diabetes type II. Ghada states fasting BGs in the 90s and denies any hypoglycemic episodes.  She has been working on intensive lifestyle modifications including diet, exercise, and weight loss to help control her blood glucose levels.  Depression with emotional eating behaviors Shalondra is struggling with emotional eating and using food for comfort to the extent that it is negatively impacting her health. She often snacks when she is not hungry. Messiah sometimes feels she is out of control and then feels guilty that she made poor food choices. She has been working on behavior modification techniques to help reduce her emotional eating and has been somewhat successful. She shows no sign of suicidal or homicidal ideations.  Depression screen PHQ 2/9 01/12/2019  Decreased Interest 1  Down, Depressed, Hopeless 3  PHQ - 2 Score 4  Altered sleeping 3  Tired, decreased energy 3  Change in appetite 2  Feeling bad or failure about yourself  1  Trouble concentrating 0  Moving slowly or fidgety/restless 2  Suicidal thoughts 1  PHQ-9 Score 16  Difficult doing work/chores Somewhat difficult    ASSESSMENT AND PLAN:  Type 2 diabetes mellitus with hyperglycemia, without long-term current  use of insulin (HCC)  Other depression - with emotional eating  Class 3 severe obesity with serious comorbidity and body mass index (BMI) of 40.0 to 44.9 in adult, unspecified obesity type ([place])  PLAN: Diabetes II Camy has been given extensive diabetes education by myself today including ideal fasting and post-prandial blood glucose readings, individual ideal HgA1c goals  and hypoglycemia prevention. We discussed the importance of good blood sugar control to decrease the likelihood of diabetic complications such as nephropathy, neuropathy, limb loss, blindness, coronary artery disease, and death. We discussed the importance of intensive lifestyle modification including diet, exercise and weight loss as the first line treatment for diabetes. We discussed decreasing carbohydrates, increasing protein, and increasing PUFA, MUFA.  Resha agrees to continue her diabetes medications and will follow up at the agreed upon time.  Depression with Emotional Eating Behaviors We discussed behavior modification techniques today to help Reighlyn deal with her emotional eating and depression. She  agreed to follow up as directed.  Obesity Korrin is currently in the action stage of change. As such, her goal is to continue with weight loss efforts She has agreed to keep a food journal with 1150-1300 calories and 80+g of protein daily and follow the Category 2 plan or Pescatarian +100 calories with Category 3 lunch.  Jaylani has been instructed to work up to a goal of 150 minutes of combined cardio and strengthening exercise per week for weight loss and overall health benefits. We  discussed the following Behavioral Modification Stratagies today:increasing water intake, no skipping meals, increasing lean protein intake, decreasing simple carbohydrates , increasing vegetables, decrease eating out and work on meal planning and easy cooking plans We discussed meal planning, intentional eating, decreasing weight for  hip surgery only needs to lose 3 lbs, and increasing water 4 glasses.   Ayauna has agreed to follow up with our clinic in 2-3 weeks. She was informed of the importance of frequent follow up visits to maximize her success with intensive lifestyle modifications for her multiple health conditions.  ALLERGIES: Allergies  Allergen Reactions  . Clindamycin/Lincomycin   . Erythromycin   . Sulfa Antibiotics     MEDICATIONS: Current Outpatient Medications on File Prior to Visit  Medication Sig Dispense Refill  . azelastine (ASTELIN) 0.1 % nasal spray Place 2 sprays into both nostrils 2 (two) times daily. Use in each nostril as directed    . blood glucose meter kit and supplies Dispense based on patient and insurance preference. Use up to four times daily as directed. (FOR ICD-10 E10.9, E11.9). 1 each 0  . cetirizine (ZYRTEC) 10 MG tablet Take 10 mg by mouth daily.    Sydney Leach estradiol (ESTRACE) 0.5 MG tablet Take 0.5 mg by mouth every other day.    . fluticasone (FLONASE) 50 MCG/ACT nasal spray Place 2 sprays into both nostrils daily.    Sydney Leach glucose blood (FREESTYLE TEST STRIPS) test strip Use as instructed 100 each 0  . glucose monitoring kit (FREESTYLE) monitoring kit 1 each by Does not apply route as needed for other. Test twice daily 1 each 0  . Lancets (FREESTYLE) lancets Use as instructed 100 each 0  . lisinopril (ZESTRIL) 20 MG tablet Take 20 mg by mouth daily.    . Loperamide HCl (IMODIUM A-D PO) Take by mouth 1 day or 1 dose.    . meloxicam (MOBIC) 15 MG tablet Take 15 mg by mouth daily.    . montelukast (SINGULAIR) 10 MG tablet Take 10 mg by mouth as needed.    . Omega-3 Fatty Acids (FISH OIL) 1000 MG CAPS Take 1,000 mg by mouth.    Sydney Leach omeprazole (PRILOSEC) 40 MG capsule Take 40 mg by mouth daily.    . pregabalin (LYRICA) 150 MG capsule Take 150 mg by mouth daily.     . sitaGLIPtin-metformin (JANUMET) 50-1000 MG tablet Take 1 tablet by mouth 2 (two) times daily with a meal.    .  spironolactone (ALDACTONE) 25 MG tablet Take 25 mg by mouth daily. 1/2 tab qd per pt    . traMADol (ULTRAM) 50 MG tablet Take 50 mg by mouth 3 (three) times daily. 1-2 TID     No current facility-administered medications on file prior to visit.     PAST MEDICAL HISTORY: Past Medical History:  Diagnosis Date  . Bilateral swelling of feet   . Bright's disease   . Chronic fatigue syndrome   . Chronic pain   . Chronic stasis dermatitis   . Constipation   . Degenerative arthritis   . Edema   . Fatty liver   . Fibromyalgia   . GERD (gastroesophageal reflux disease)   . History of colon polyps   . Hypertension   . IBS (irritable bowel syndrome)   . Joint pain   . Kidney problem   . Lumbar disc narrowing   . Morbid obesity ([place])   . Osteoarthritis   . Plantar fasciitis   . Skin cancer   . Sleep apnea   .  Swallowing difficulty   . Type 2 diabetes mellitus ([place])   . Ventricular septal defect   . Vitamin D deficiency     PAST SURGICAL HISTORY: Past Surgical History:  Procedure Laterality Date  . [place]  . MICRODISCECTOMY LUMBAR  2017  . PARTIAL HYSTERECTOMY  1997  . TUBAL LIGATION  1985    SOCIAL HISTORY: Social History   Tobacco Use  . Smoking status: Never Smoker  . Smokeless tobacco: Never Used  Substance Use Topics  . Alcohol use: Not on file  . Drug use: Not on file    FAMILY HISTORY: Family History  Problem Relation Age of Onset  . Hyperlipidemia Mother   . Heart disease Mother   . Stroke Mother   . Alcoholism Mother     ROS: Review of Systems  Constitutional: Positive for weight loss.  Endo/Heme/Allergies:       Negative for hypoglycemia   Psychiatric/Behavioral: Positive for depression. Negative for suicidal ideas.    PHYSICAL EXAM: Blood pressure 121/69, pulse 70, temperature 98.7 F (37.1 C), temperature source Oral, height _0  (1.6 m), weight 233 lb (105.7 kg), SpO2 96 %. Body mass index is 41.27 kg/m. Physical Exam  Vitals signs reviewed.  Constitutional:      Appearance: Normal appearance. She is obese.  HENT:     Head: Normocephalic.     Nose: Nose normal.  Neck:     Musculoskeletal: Normal range of motion.  Cardiovascular:     Rate and Rhythm: Normal rate.  Pulmonary:     Effort: Pulmonary effort is normal.  Musculoskeletal: Normal range of motion.  Skin:    General: Skin is warm and dry.  Neurological:     Mental Status: She is alert and oriented to person, place, and time.  Psychiatric:        Mood and Affect: Mood normal.        Behavior: Behavior normal.     RECENT LABS AND TESTS: BMET    Component Value Date/Time   NA 138 01/12/2019 1438   K 4.9 01/12/2019 1438   CL 99 01/12/2019 1438   CO2 22 01/12/2019 1438   GLUCOSE 103 (H) 01/12/2019 1438   BUN 16 01/12/2019 1438   CREATININE 0.72 01/12/2019 1438   CALCIUM 9.8 01/12/2019 1438   GFRNONAA 88 01/12/2019 1438   GFRAA 102 01/12/2019 1438   No results found for: HGBA1C Lab Results  Component Value Date   INSULIN 21.3 01/12/2019   CBC No results found for: WBC, RBC, HGB, HCT, PLT, MCV, MCH, MCHC, RDW, LYMPHSABS, MONOABS, EOSABS, BASOSABS Iron/TIBC/Ferritin/ %Sat No results found for: IRON, TIBC, FERRITIN, IRONPCTSAT Lipid Panel  No results found for: CHOL, TRIG, HDL, CHOLHDL, VLDL, LDLCALC, LDLDIRECT Hepatic Function Panel     Component Value Date/Time   PROT 7.0 01/12/2019 1438   ALBUMIN 4.2 01/12/2019 1438   AST 67 (H) 01/12/2019 1438   ALT 76 (H) 01/12/2019 1438   ALKPHOS 131 (H) 01/12/2019 1438   BILITOT 0.3 01/12/2019 1438      Component Value Date/Time   TSH 2.930 01/12/2019 1438      OBESITY BEHAVIORAL INTERVENTION VISIT  Today's visit was # 7   Starting weight: 251 lbs Starting date: 01/12/19 Today's weight : Weight: 233 lb (105.7 kg)  Today's date: 04/21/2019 Total lbs lost to date: 18 lbs At least 15 minutes were spent on discussing the following behavioral intervention visit.   ASK:  We discussed the diagnosis of obesity with  Leamon Arnt Dittmer today and Leaner agreed to give [place] permission to discuss obesity behavioral modification therapy today.  ASSESS: Leianne has the diagnosis of obesity and her BMI today is 41.28 Eleen is in the action stage of change   ADVISE: Morrissa was educated on the multiple health risks of obesity as well as the benefit of weight loss to improve her health. She was advised of the need for long term treatment and the importance of lifestyle modifications to improve her current health and to decrease her risk of future health problems.  AGREE: Multiple dietary modification options and treatment options were discussed and  Aniah agreed to follow the recommendations documented in the above note.  ARRANGE: Phyllicia was educated on the importance of frequent visits to treat obesity as outlined per CMS and USPSTF guidelines and agreed to schedule her next follow up appointment today.  Leary Roca, am acting as transcriptionist for [employer], DO   I have reviewed the above documentation for accuracy and completeness, and I agree with the above. -Jearld Lesch, DO

## 2019-04-28 DIAGNOSIS — Z6841 Body Mass Index (BMI) 40.0 and over, adult: Secondary | ICD-10-CM | POA: Diagnosis not present

## 2019-04-28 DIAGNOSIS — M1612 Unilateral primary osteoarthritis, left hip: Secondary | ICD-10-CM | POA: Diagnosis not present

## 2019-04-28 DIAGNOSIS — M1711 Unilateral primary osteoarthritis, right knee: Secondary | ICD-10-CM | POA: Diagnosis not present

## 2019-04-28 DIAGNOSIS — M1712 Unilateral primary osteoarthritis, left knee: Secondary | ICD-10-CM | POA: Diagnosis not present

## 2019-05-02 ENCOUNTER — Telehealth: Payer: Self-pay | Admitting: Emergency Medicine

## 2019-05-02 NOTE — Telephone Encounter (Signed)
Called patient to get her an appointment to see Dr. Agustin Cree. We received surgical clearance for her but have not seen her since he was at [facility]. Patient scheduled with Dr. Harriet Masson in [facility] this week due to no openings soon in Dr. Wendy Poet schedule. Patient aware of location and appointment. No further questions. Will fax clearance form to [facility].

## 2019-05-04 ENCOUNTER — Other Ambulatory Visit: Payer: Self-pay

## 2019-05-04 ENCOUNTER — Ambulatory Visit (INDEPENDENT_AMBULATORY_CARE_PROVIDER_SITE_OTHER): Payer: PPO | Admitting: Cardiology

## 2019-05-04 ENCOUNTER — Encounter: Payer: Self-pay | Admitting: Cardiology

## 2019-05-04 VITALS — BP 130/80 | HR 71 | Ht 63.0 in | Wt 231.0 lb

## 2019-05-04 DIAGNOSIS — Z01810 Encounter for preprocedural cardiovascular examination: Secondary | ICD-10-CM | POA: Diagnosis not present

## 2019-05-04 DIAGNOSIS — I10 Essential (primary) hypertension: Secondary | ICD-10-CM

## 2019-05-04 DIAGNOSIS — Z6841 Body Mass Index (BMI) 40.0 and over, adult: Secondary | ICD-10-CM | POA: Diagnosis not present

## 2019-05-04 DIAGNOSIS — I493 Ventricular premature depolarization: Secondary | ICD-10-CM

## 2019-05-04 DIAGNOSIS — E119 Type 2 diabetes mellitus without complications: Secondary | ICD-10-CM | POA: Diagnosis not present

## 2019-05-04 NOTE — Patient Instructions (Signed)
Medication Instructions:  Your physician recommends that you continue on your current medications as directed. Please refer to the Current Medication list given to you today.  *If you need a refill on your cardiac medications before your next appointment, please call your pharmacy*  Lab Work: Your physician recommends that you return for lab work today: BMP, Magnesium level.   If you have labs (blood work) drawn today and your tests are completely normal, you will receive your results only by: Marland Kitchen MyChart Message (if you have MyChart) OR . A paper copy in the mail If you have any lab test that is abnormal or we need to change your treatment, we will call you to review the results.  Testing/Procedures: You had an EKG today.   Your physician has requested that you have a lexiscan myoview. For further information please visit [email or web address]. Please follow instruction sheet, as given.  [facility] [address], [place], [place] [address] [phone]  Lexiscan testing instructions:  Please present to [facility] 15 minutes earlier than your appointment time to allow for registration.  You will be called with an appointment date and time by our office once it has been scheduled; please allow at least 48 hours for [place] to contact you.  No food or drink after midnight prior to your test (except for small sips of water with your medications).  Hold the following medications the morning of your test: spironolactone and janumet  Bring a medication list or all your medications with you the morning of the testing.  No caffeine, decaffeinated or chocolate products 12 hours prior to the testing.  Please be aware that the test can take up to 3-4 hours.    Should you have any problem with the appointment date or time, please call [phone].   Please call the office with any further questions or concerns.  Follow-Up: At [facility], you and your health  needs are our priority.  As part of our continuing mission to provide you with exceptional heart care, we have created designated Provider Care Teams.  These Care Teams include your primary Cardiologist (physician) and Advanced Practice Providers (APPs -  Physician Assistants and Nurse Practitioners) who all work together to provide you with the care you need, when you need it.  Your next appointment:   2 months   The format for your next appointment:   In Person  Provider:   Berniece Salines, DO     Cardiac Nuclear Scan A cardiac nuclear scan is a test that is done to check the flow of blood to your heart. It is done when you are resting and when you are exercising. The test looks for problems such as:  Not enough blood reaching a portion of the heart.  The heart muscle not working as it should. You may need this test if:  You have heart disease.  You have had lab results that are not normal.  You have had heart surgery or a balloon procedure to open up blocked arteries (angioplasty).  You have chest pain.  You have shortness of breath. In this test, a special dye (tracer) is put into your bloodstream. The tracer will travel to your heart. A camera will then take pictures of your heart to see how the tracer moves through your heart. This test is usually done at a hospital and takes 2-4 hours. Tell a doctor about:  Any allergies you have.  All medicines you are taking, including vitamins, herbs,  eye drops, creams, and over-the-counter medicines.  Any problems you or family members have had with anesthetic medicines.  Any blood disorders you have.  Any surgeries you have had.  Any medical conditions you have.  Whether you are pregnant or may be pregnant. What are the risks? Generally, this is a safe test. However, problems may occur, such as:  Serious chest pain and heart attack. This is only a risk if the stress portion of the test is done.  Rapid heartbeat.  A  feeling of warmth in your chest. This feeling usually does not last long.  Allergic reaction to the tracer. What happens before the test?  Ask your doctor about changing or stopping your normal medicines. This is important.  Follow instructions from your doctor about what you cannot eat or drink.  Remove your jewelry on the day of the test. What happens during the test?  An IV tube will be inserted into one of your veins.  Your doctor will give you a small amount of tracer through the IV tube.  You will wait for 20-40 minutes while the tracer moves through your bloodstream.  Your heart will be monitored with an electrocardiogram (ECG).  You will lie down on an exam table.  Pictures of your heart will be taken for about 15-20 minutes.  You may also have a stress test. For this test, one of these things may be done: ? You will be asked to exercise on a treadmill or a stationary bike. ? You will be given medicines that will make your heart work harder. This is done if you are unable to exercise.  When blood flow to your heart has peaked, a tracer will again be given through the IV tube.  After 20-40 minutes, you will get back on the exam table. More pictures will be taken of your heart.  Depending on the tracer that is used, more pictures may need to be taken 3-4 hours later.  Your IV tube will be removed when the test is over. The test may vary among doctors and hospitals. What happens after the test?  Ask your doctor: ? Whether you can return to your normal schedule, including diet, activities, and medicines. ? Whether you should drink more fluids. This will help to remove the tracer from your body. Drink enough fluid to keep your pee (urine) pale yellow.  Ask your doctor, or the department that is doing the test: ? When will my results be ready? ? How will I get my results? Summary  A cardiac nuclear scan is a test that is done to check the flow of blood to your  heart.  Tell your doctor whether you are pregnant or may be pregnant.  Before the test, ask your doctor about changing or stopping your normal medicines. This is important.  Ask your doctor whether you can return to your normal activities. You may be asked to drink more fluids. This information is not intended to replace advice given to you by your health care provider. Make sure you discuss any questions you have with your health care provider. Document Released: 11/23/2017 Document Revised: 09/29/2018 Document Reviewed: 11/23/2017 Elsevier Patient Education  2020 [employer].

## 2019-05-04 NOTE — Progress Notes (Signed)
Cardiology Office Note:    Date:  05/04/2019   ID:  Sydney Leach, DOB [birth year], MRN [id number]  PCP:  Nicoletta Dress, MD  Cardiologist:  Berniece Salines, DO  Electrophysiologist:  None   Referring MD: Nicoletta Dress, MD   Chief Complaint  Patient presents with  . Pre-op Exam    Left hip surgery    History of Present Illness:    Sydney Leach is a 63 y.o. female with a hx of hypertension, hyperlipidemia, GERD, obesity presents for preoperative clearance.  Patient has not seen cardiology over 5 years.  She tells me that she has been doing well. She has actively been losing weight in preparation of her hip surgery.  She is limited in activity by her hip and back.  Past Medical History:  Diagnosis Date  . Bilateral swelling of feet   . Bright's disease   . Chronic fatigue syndrome   . Chronic pain   . Chronic stasis dermatitis   . Constipation   . Degenerative arthritis   . Edema   . Fatty liver   . Fibromyalgia   . GERD (gastroesophageal reflux disease)   . History of colon polyps   . Hypertension   . IBS (irritable bowel syndrome)   . Joint pain   . Kidney problem   . Lumbar disc narrowing   . Morbid obesity ([place])   . Osteoarthritis   . Plantar fasciitis   . Skin cancer   . Sleep apnea   . Swallowing difficulty   . Type 2 diabetes mellitus ([place])   . Ventricular septal defect   . Vitamin D deficiency     Past Surgical History:  Procedure Laterality Date  . [place]  . MICRODISCECTOMY LUMBAR  2017  . PARTIAL HYSTERECTOMY  1997  . TUBAL LIGATION  1985    Current Medications: Current Meds  Medication Sig  . amoxicillin (AMOXIL) 500 MG capsule TAKE FOUR CAPSULES BY MOUTH ONE HOUR BEFORE APPOINTMENT  . blood glucose meter kit and supplies Dispense based on patient and insurance preference. Use up to four times daily as directed. (FOR ICD-10 E10.9, E11.9).  . cetirizine (ZYRTEC) 10 MG tablet Take 10 mg by mouth daily.  Marland Kitchen  dicyclomine (BENTYL) 20 MG tablet Take 20 mg by mouth daily.  Marland Kitchen estradiol (ESTRACE) 0.5 MG tablet Take 0.5 mg by mouth every other day.  . fluticasone (FLONASE) 50 MCG/ACT nasal spray Place 2 sprays into both nostrils daily.  Marland Kitchen glucose blood (FREESTYLE TEST STRIPS) test strip Use as instructed  . glucose monitoring kit (FREESTYLE) monitoring kit 1 each by Does not apply route as needed for other. Test twice daily  . Lancets (FREESTYLE) lancets Use as instructed  . lisinopril (ZESTRIL) 20 MG tablet Take 20 mg by mouth daily.  . Loperamide HCl (IMODIUM A-D PO) Take by mouth 1 day or 1 dose.  . meloxicam (MOBIC) 15 MG tablet Take 15 mg by mouth daily.  . montelukast (SINGULAIR) 10 MG tablet Take 10 mg by mouth as needed.  . Omega-3 Fatty Acids (FISH OIL) 1000 MG CAPS Take 1,000 mg by mouth.  Marland Kitchen omeprazole (PRILOSEC) 40 MG capsule Take 40 mg by mouth daily.  . sitaGLIPtin-metformin (JANUMET) 50-1000 MG tablet Take 1 tablet by mouth 2 (two) times daily with a meal.  . spironolactone (ALDACTONE) 25 MG tablet Take 25 mg by mouth daily. 1/2 tab qd per pt  . traMADol (ULTRAM) 50 MG tablet Take 50 mg  by mouth 3 (three) times daily. 1-2 TID  . [DISCONTINUED] azelastine (ASTELIN) 0.1 % nasal spray Place 2 sprays into both nostrils 2 (two) times daily. Use in each nostril as directed  . [DISCONTINUED] pregabalin (LYRICA) 150 MG capsule Take 150 mg by mouth daily.      Allergies:   Tapentadol, Clindamycin/lincomycin, Erythromycin, Pneumococcal polysaccharide vaccine, and Sulfa antibiotics   Social History   Socioeconomic History  . Marital status: Married    Spouse name: Not on file  . Number of children: Not on file  . Years of education: Not on file  . Highest education level: Not on file  Occupational History  . Not on file  Social Needs  . Financial resource strain: Not on file  . Food insecurity    Worry: Not on file    Inability: Not on file  . Transportation needs    Medical: Not on file     Non-medical: Not on file  Tobacco Use  . Smoking status: Never Smoker  . Smokeless tobacco: Never Used  Substance and Sexual Activity  . Alcohol use: Not on file  . Drug use: Not on file  . Sexual activity: Not on file  Lifestyle  . Physical activity    Days per week: Not on file    Minutes per session: Not on file  . Stress: Not on file  Relationships  . Social Herbalist on phone: Not on file    Gets together: Not on file    Attends religious service: Not on file    Active member of club or organization: Not on file    Attends meetings of clubs or organizations: Not on file    Relationship status: Not on file  Other Topics Concern  . Not on file  Social History Narrative  . Not on file     Family History: The patient's family history includes Alcoholism in her mother; Heart disease in her mother; Hyperlipidemia in her mother; Stroke in her mother.  ROS:   Review of Systems  Constitution: Negative for decreased appetite, fever and weight gain.  HENT: Negative for congestion, ear discharge, hoarse voice and sore throat.   Eyes: Negative for discharge, redness, vision loss in right eye and visual halos.  Cardiovascular: Negative for chest pain, dyspnea on exertion, leg swelling, orthopnea and palpitations.  Respiratory: Negative for cough, hemoptysis, shortness of breath and snoring.   Endocrine: Negative for heat intolerance and polyphagia.  Hematologic/Lymphatic: Negative for bleeding problem. Does not bruise/bleed easily.  Skin: Negative for flushing, nail changes, rash and suspicious lesions.  Musculoskeletal: Negative for arthritis, joint pain, muscle cramps, myalgias, neck pain and stiffness.  Gastrointestinal: Negative for abdominal pain, bowel incontinence, diarrhea and excessive appetite.  Genitourinary: Negative for decreased libido, genital sores and incomplete emptying.  Neurological: Negative for brief paralysis, focal weakness, headaches and loss  of balance.  Psychiatric/Behavioral: Negative for altered mental status, depression and suicidal ideas.  Allergic/Immunologic: Negative for HIV exposure and persistent infections.    EKGs/Labs/Other Studies Reviewed:    The following studies were reviewed today:   EKG:  The ekg ordered today demonstrates sinus rhythm, with occasional PVCs and left axis deviation.  No prior EKG for comparison.  Recent Labs: 01/12/2019: ALT 76; BUN 16; Creatinine, Ser 0.72; Potassium 4.9; Sodium 138; TSH 2.930  Recent Lipid Panel No results found for: CHOL, TRIG, HDL, CHOLHDL, VLDL, LDLCALC, LDLDIRECT  Physical Exam:    VS:  BP 130/80 (BP Location: Right  Arm, Patient Position: Sitting, Cuff Size: Normal)   Pulse 71   Ht '5\' 3"'  (1.6 m)   Wt 231 lb (104.8 kg)   SpO2 94%   BMI 40.92 kg/m     Wt Readings from Last 3 Encounters:  05/04/19 231 lb (104.8 kg)  04/21/19 233 lb (105.7 kg)  04/06/19 237 lb (107.5 kg)     GEN: Well nourished, well developed in no acute distress HEENT: Normal NECK: No JVD; No carotid bruits LYMPHATICS: No lymphadenopathy CARDIAC: S1S2 noted,RRR, no murmurs, rubs, gallops RESPIRATORY:  Clear to auscultation without rales, wheezing or rhonchi  ABDOMEN: Soft, non-tender, non-distended, +bowel sounds, no guarding. EXTREMITIES: No edema, No cyanosis, no clubbing MUSCULOSKELETAL:  No edema; No deformity  SKIN: Warm and dry NEUROLOGIC:  Alert and oriented x 3, non-focal PSYCHIATRIC:  Normal affect, good insight  ASSESSMENT:    1. Essential hypertension   2. Pre-operative cardiovascular examination   3. Type 2 diabetes mellitus without complication, without long-term current use of insulin (HCC)   4. Class 3 severe obesity with body mass index (BMI) of 40.0 to 44.9 in adult, unspecified obesity type, unspecified whether serious comorbidity present ([place])   5. PVC (premature ventricular contraction)    PLAN:    1.  Here for preop clearance for pending hip surgery at this  time of like to have patient have a pharmacologic nuclear stress test prior to her procedure. This is because of her limited activities and high risk factors. If her stress test is normal she can proceed with her hip surgery.  2.  Hypertension- her bp is acceptable today. She will continue on her current medication regimen.  3.  Diabetes Mellitus - continue patient on her current medication regimen.   4. The patient in actively dieting and has been loosing weight in planning of her surgery. Her goal is a BMI < 39 prior to her hip surgery. I am optimistic that she can achieve this goal.    5. PVC noted on her ecg - no symptomatic we will continue to monitor.  The patient is in agreement with the above plan. The patient left the office in stable condition.  The patient will follow up in 2 months.   Medication Adjustments/Labs and Tests Ordered: Current medicines are reviewed at length with the patient today.  Concerns regarding medicines are outlined above.  Orders Placed This Encounter  Procedures  . Basic Metabolic Panel (BMET)  . Magnesium  . MYOCARDIAL PERFUSION IMAGING  . EKG 12-Lead   No orders of the defined types were placed in this encounter.   Patient Instructions  Medication Instructions:  Your physician recommends that you continue on your current medications as directed. Please refer to the Current Medication list given to you today.  *If you need a refill on your cardiac medications before your next appointment, please call your pharmacy*  Lab Work: Your physician recommends that you return for lab work today: BMP, Magnesium level.   If you have labs (blood work) drawn today and your tests are completely normal, you will receive your results only by: Marland Kitchen MyChart Message (if you have MyChart) OR . A paper copy in the mail If you have any lab test that is abnormal or we need to change your treatment, we will call you to review the results.  Testing/Procedures: You had  an EKG today.   Your physician has requested that you have a lexiscan myoview. For further information please visit [email or web address]. Please follow instruction sheet,  as given.  [facility] [address] [place], [place] [address] [phone]  Lexiscan testing instructions:  Please present to [facility] 15 minutes earlier than your appointment time to allow for registration.  You will be called with an appointment date and time by our office once it has been scheduled; please allow at least 48 hours for [place] to contact you.  No food or drink after midnight prior to your test (except for small sips of water with your medications).  Hold the following medications the morning of your test: spironolactone and janumet  Bring a medication list or all your medications with you the morning of the testing.  No caffeine, decaffeinated or chocolate products 12 hours prior to the testing.  Please be aware that the test can take up to 3-4 hours.    Should you have any problem with the appointment date or time, please call [phone].   Please call the office with any further questions or concerns.  Follow-Up: At [facility], you and your health needs are our priority.  As part of our continuing mission to provide you with exceptional heart care, we have created designated Provider Care Teams.  These Care Teams include your primary Cardiologist (physician) and Advanced Practice Providers (APPs -  Physician Assistants and Nurse Practitioners) who all work together to provide you with the care you need, when you need it.  Your next appointment:   2 months   The format for your next appointment:   In Person  Provider:   Berniece Salines, DO     Cardiac Nuclear Scan A cardiac nuclear scan is a test that is done to check the flow of blood to your heart. It is done when you are resting and when you are exercising. The test looks for problems such as:  Not  enough blood reaching a portion of the heart.  The heart muscle not working as it should. You may need this test if:  You have heart disease.  You have had lab results that are not normal.  You have had heart surgery or a balloon procedure to open up blocked arteries (angioplasty).  You have chest pain.  You have shortness of breath. In this test, a special dye (tracer) is put into your bloodstream. The tracer will travel to your heart. A camera will then take pictures of your heart to see how the tracer moves through your heart. This test is usually done at a hospital and takes 2-4 hours. Tell a doctor about:  Any allergies you have.  All medicines you are taking, including vitamins, herbs, eye drops, creams, and over-the-counter medicines.  Any problems you or family members have had with anesthetic medicines.  Any blood disorders you have.  Any surgeries you have had.  Any medical conditions you have.  Whether you are pregnant or may be pregnant. What are the risks? Generally, this is a safe test. However, problems may occur, such as:  Serious chest pain and heart attack. This is only a risk if the stress portion of the test is done.  Rapid heartbeat.  A feeling of warmth in your chest. This feeling usually does not last long.  Allergic reaction to the tracer. What happens before the test?  Ask your doctor about changing or stopping your normal medicines. This is important.  Follow instructions from your doctor about what you cannot eat or drink.  Remove your jewelry on the day of the test. What happens during  the test?  An IV tube will be inserted into one of your veins.  Your doctor will give you a small amount of tracer through the IV tube.  You will wait for 20-40 minutes while the tracer moves through your bloodstream.  Your heart will be monitored with an electrocardiogram (ECG).  You will lie down on an exam table.  Pictures of your heart will be  taken for about 15-20 minutes.  You may also have a stress test. For this test, one of these things may be done: ? You will be asked to exercise on a treadmill or a stationary bike. ? You will be given medicines that will make your heart work harder. This is done if you are unable to exercise.  When blood flow to your heart has peaked, a tracer will again be given through the IV tube.  After 20-40 minutes, you will get back on the exam table. More pictures will be taken of your heart.  Depending on the tracer that is used, more pictures may need to be taken 3-4 hours later.  Your IV tube will be removed when the test is over. The test may vary among doctors and hospitals. What happens after the test?  Ask your doctor: ? Whether you can return to your normal schedule, including diet, activities, and medicines. ? Whether you should drink more fluids. This will help to remove the tracer from your body. Drink enough fluid to keep your pee (urine) pale yellow.  Ask your doctor, or the department that is doing the test: ? When will my results be ready? ? How will I get my results? Summary  A cardiac nuclear scan is a test that is done to check the flow of blood to your heart.  Tell your doctor whether you are pregnant or may be pregnant.  Before the test, ask your doctor about changing or stopping your normal medicines. This is important.  Ask your doctor whether you can return to your normal activities. You may be asked to drink more fluids. This information is not intended to replace advice given to you by your health care provider. Make sure you discuss any questions you have with your health care provider. Document Released: 11/23/2017 Document Revised: 09/29/2018 Document Reviewed: 11/23/2017 Elsevier Patient Education  2020 [employer].      Adopting a Healthy Lifestyle.  Know what a healthy weight is for you (roughly BMI <25) and aim to maintain this   Aim for 7+  servings of fruits and vegetables daily   65-80+ fluid ounces of water or unsweet tea for healthy kidneys   Limit to max 1 drink of alcohol per day; avoid smoking/tobacco   Limit animal fats in diet for cholesterol and heart health - choose grass fed whenever available   Avoid highly processed foods, and foods high in saturated/trans fats   Aim for low stress - take time to unwind and care for your mental health   Aim for 150 min of moderate intensity exercise weekly for heart health, and weights twice weekly for bone health   Aim for 7-9 hours of sleep daily   When it comes to diets, agreement about the perfect plan isnt easy to find, even among the experts. Experts at the [employer] developed an idea known as the Healthy Eating Plate. Just imagine a plate divided into logical, healthy portions.   The emphasis is on diet quality:   Load up on vegetables and fruits -  one-half of your plate: Aim for color and variety, and remember that potatoes dont count.   Go for whole grains - one-quarter of your plate: Whole wheat, barley, wheat berries, quinoa, oats, brown rice, and foods made with them. If you want pasta, go with whole wheat pasta.   Protein power - one-quarter of your plate: Fish, chicken, beans, and nuts are all healthy, versatile protein sources. Limit red meat.   The diet, however, does go beyond the plate, offering a few other suggestions.   Use healthy plant oils, such as olive, canola, soy, corn, sunflower and peanut. Check the labels, and avoid partially hydrogenated oil, which have unhealthy trans fats.   If youre thirsty, drink water. Coffee and tea are good in moderation, but skip sugary drinks and limit milk and dairy products to one or two daily servings.   The type of carbohydrate in the diet is more important than the amount. Some sources of carbohydrates, such as vegetables, fruits, whole grains, and beans-are healthier than others.    Finally, stay active  Signed, Berniece Salines, DO  05/04/2019 2:17 PM    [facility]

## 2019-05-05 ENCOUNTER — Telehealth: Payer: Self-pay

## 2019-05-05 LAB — BASIC METABOLIC PANEL
BUN/Creatinine Ratio: 26 (ref 12–28)
BUN: 19 mg/dL (ref 8–27)
CO2: 23 mmol/L (ref 20–29)
Calcium: 9.9 mg/dL (ref 8.7–10.3)
Chloride: 101 mmol/L (ref 96–106)
Creatinine, Ser: 0.72 mg/dL (ref 0.57–1.00)
GFR calc Af Amer: 102 mL/min/{1.73_m2} (ref >59)
GFR calc non Af Amer: 88 mL/min/{1.73_m2} (ref >59)
Glucose: 94 mg/dL (ref 65–99)
Potassium: 4.9 mmol/L (ref 3.5–5.2)
Sodium: 139 mmol/L (ref 134–144)

## 2019-05-05 LAB — MAGNESIUM: Magnesium: 1.7 mg/dL (ref 1.6–2.3)

## 2019-05-05 NOTE — Telephone Encounter (Signed)
Patient informed of Lexiscan procedure date Nov. 16 2020 with check-in time 0730, procedure start time 0800. Informed of location and nothing to eat or drink after midnight night before test. All other instructions given to patient at appt.05/04/19. Verbal understanding received.

## 2019-05-06 ENCOUNTER — Telehealth: Payer: Self-pay | Admitting: Cardiology

## 2019-05-06 NOTE — Telephone Encounter (Signed)
Telephone call to patient. Concerned about reading she had PVC's on her EKG. Informed her they were occasional and that along with the left axis deviation were why we are get a nuclear stress test to clear her for hip surgery. Patient satisfied and no further questions asked.

## 2019-05-06 NOTE — Telephone Encounter (Signed)
Patient called with questions from her AVS  Please call patient to discuss

## 2019-05-09 ENCOUNTER — Ambulatory Visit (INDEPENDENT_AMBULATORY_CARE_PROVIDER_SITE_OTHER): Payer: PPO | Admitting: Family Medicine

## 2019-05-09 ENCOUNTER — Other Ambulatory Visit: Payer: Self-pay

## 2019-05-09 ENCOUNTER — Encounter (INDEPENDENT_AMBULATORY_CARE_PROVIDER_SITE_OTHER): Payer: Self-pay | Admitting: Family Medicine

## 2019-05-09 VITALS — BP 103/65 | HR 67 | Temp 97.9°F | Ht 63.0 in | Wt 229.0 lb

## 2019-05-09 DIAGNOSIS — Z6841 Body Mass Index (BMI) 40.0 and over, adult: Secondary | ICD-10-CM

## 2019-05-09 DIAGNOSIS — E119 Type 2 diabetes mellitus without complications: Secondary | ICD-10-CM

## 2019-05-09 DIAGNOSIS — Z01810 Encounter for preprocedural cardiovascular examination: Secondary | ICD-10-CM | POA: Diagnosis not present

## 2019-05-09 DIAGNOSIS — I10 Essential (primary) hypertension: Secondary | ICD-10-CM | POA: Diagnosis not present

## 2019-05-09 DIAGNOSIS — Z01818 Encounter for other preprocedural examination: Secondary | ICD-10-CM | POA: Diagnosis not present

## 2019-05-10 ENCOUNTER — Encounter (INDEPENDENT_AMBULATORY_CARE_PROVIDER_SITE_OTHER): Payer: Self-pay | Admitting: Family Medicine

## 2019-05-10 ENCOUNTER — Telehealth: Payer: Self-pay | Admitting: *Deleted

## 2019-05-10 DIAGNOSIS — Z9181 History of falling: Secondary | ICD-10-CM | POA: Diagnosis not present

## 2019-05-10 DIAGNOSIS — Z0000 Encounter for general adult medical examination without abnormal findings: Secondary | ICD-10-CM | POA: Diagnosis not present

## 2019-05-10 DIAGNOSIS — E559 Vitamin D deficiency, unspecified: Secondary | ICD-10-CM | POA: Diagnosis not present

## 2019-05-10 DIAGNOSIS — Z6839 Body mass index (BMI) 39.0-39.9, adult: Secondary | ICD-10-CM | POA: Diagnosis not present

## 2019-05-10 DIAGNOSIS — M1610 Unilateral primary osteoarthritis, unspecified hip: Secondary | ICD-10-CM | POA: Diagnosis not present

## 2019-05-10 NOTE — Telephone Encounter (Signed)
Telephone call to patient . Left message that stress test was negative and that we were send [facility] her records and clearance form.
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
## 2019-05-11 ENCOUNTER — Encounter (INDEPENDENT_AMBULATORY_CARE_PROVIDER_SITE_OTHER): Payer: Self-pay | Admitting: Family Medicine

## 2019-05-12 ENCOUNTER — Other Ambulatory Visit: Payer: Self-pay | Admitting: Orthopedic Surgery

## 2019-05-17 DIAGNOSIS — N390 Urinary tract infection, site not specified: Secondary | ICD-10-CM | POA: Diagnosis not present

## 2019-05-17 DIAGNOSIS — R8290 Unspecified abnormal findings in urine: Secondary | ICD-10-CM | POA: Diagnosis not present

## 2019-05-18 NOTE — Patient Instructions (Addendum)
DUE TO COVID-19 ONLY ONE VISITOR IS ALLOWED TO COME WITH YOU AND STAY IN THE WAITING ROOM ONLY DURING PRE OP AND PROCEDURE DAY OF SURGERY. THE 1 VISITOR MAY VISIT WITH YOU AFTER SURGERY IN YOUR PRIVATE ROOM DURING VISITING HOURS ONLY!  YOU NEED TO HAVE A COVID 19 TEST ON__12-1_____ @_______ , THIS TEST MUST BE DONE BEFORE SURGERY, COME  [place], [place] , [address].  ([place]) ONCE YOUR COVID TEST IS COMPLETED, PLEASE BEGIN THE QUARANTINE INSTRUCTIONS AS OUTLINED IN YOUR HANDOUT.                 [place]    Your procedure is scheduled on: 12-4   Report to [place]  Entrance   Report to [place] at 5:30AM     Call this number if you have problems the morning of surgery [phone]    NO SOLID FOOD AFTER MIDNIGHT THE NIGHT PRIOR TO SURGERY. YOU MAY DRINK CLEAR LIQUIDS.   STOP CLEAR LIQUIDS AT ___4:30AM______ AND THEN DRINK THE ENSURE PRE-SURGERY [place]. NOTHING BY MOUTH AFTER THE ENSURE DRINK!    CLEAR LIQUID DIET   Foods Allowed                                                                     Foods Excluded  Coffee and tea, regular and decaf                             liquids that you cannot  Plain Jell-O any favor except red or purple                                           see through such as: Fruit ices (not with fruit pulp)                                     milk, soups, orange juice  Iced Popsicles                                    All solid food Carbonated beverages, regular and diet                                    Cranberry, grape and apple juices Sports drinks like Gatorade Lightly seasoned clear broth or consume(fat free) Sugar, honey syrup  Sample Menu Breakfast                                Lunch                                     Supper Cranberry juice  Beef broth                            Chicken broth Jell-O                                     Grape juice                            Apple juice Coffee or tea                        Jell-O                                      Popsicle                                                Coffee or tea                        Coffee or tea  _____________________________________________________________________   BRUSH YOUR TEETH MORNING OF SURGERY AND RINSE YOUR MOUTH OUT, NO CHEWING GUM CANDY OR MINTS.     Take these medicines the morning of surgery with A SIP OF WATER: OMEPRAZOLE    How to Manage Your Diabetes Before and After Surgery  Why is it important to control my blood sugar before and after surgery? . Improving blood sugar levels before and after surgery helps healing and can limit problems. . A way of improving blood sugar control is eating a healthy diet by: o  Eating less sugar and carbohydrates o  Increasing activity/exercise o  Talking with your doctor about reaching your blood sugar goals . High blood sugars (greater than 180 mg/dL) can raise your risk of infections and slow your recovery, so you will need to focus on controlling your diabetes during the weeks before surgery. . Make sure that the doctor who takes care of your diabetes knows about your planned surgery including the date and location.  How do I manage my blood sugar before surgery? . Check your blood sugar at least 4 times a day, starting 2 days before surgery, to make sure that the level is not too high or low. o Check your blood sugar the morning of your surgery when you wake up and every 2 hours until you get to the Short Stay unit. . If your blood sugar is less than 70 mg/dL, you will need to treat for low blood sugar: o Do not take insulin. o Treat a low blood sugar (less than 70 mg/dL) with  cup of clear juice (cranberry or apple), 4 glucose tablets, OR glucose gel. o Recheck blood sugar in 15 minutes after treatment (to make sure it is greater than 70 mg/dL). If your blood sugar is not greater than 70 mg/dL on recheck, call  [phone] for further instructions. . Report your blood sugar to the short stay nurse when you get to Short Stay.  . If you are admitted to the hospital after surgery: o Your blood sugar will be checked by the staff and you will  probably be given insulin after surgery (instead of oral diabetes medicines) to make sure you have good blood sugar levels. o The goal for blood sugar control after surgery is 80-180 mg/dL.   WHAT DO I DO ABOUT MY DIABETES MEDICATION?   DO NOT TAKE ANY DIABETIC MEDICATIONS DAY OF YOUR SURGERY     Reviewed and Endorsed by [facility] Patient Education Committee, August 2015                                You may not have any metal on your body including hair pins and              piercings  Do not wear jewelry, make-up, lotions, powders or perfumes, deodorant             Do not wear nail polish on your fingernails.  Do not shave  48 hours prior to surgery.             Do not bring valuables to the hospital. [place].  Contacts, dentures or bridgework may not be worn into surgery.  YOU MAY BRING A SMALL OVERNIGHT BAG               Please read over the following fact sheets you were given: _____________________________________________________________________             [facility] - Preparing for Surgery Before surgery, you can play an important role.  Because skin is not sterile, your skin needs to be as free of germs as possible.  You can reduce the number of germs on your skin by washing with CHG (chlorahexidine gluconate) soap before surgery.  CHG is an antiseptic cleaner which kills germs and bonds with the skin to continue killing germs even after washing. Please DO NOT use if you have an allergy to CHG or antibacterial soaps.  If your skin becomes reddened/irritated stop using the CHG and inform your nurse when you arrive at Short Stay. Do not shave (including legs and underarms) for at least 48  hours prior to the first CHG shower.  You may shave your face/neck. Please follow these instructions carefully:  1.  Shower with CHG Soap the night before surgery and the  morning of Surgery.  2.  If you choose to wash your hair, wash your hair first as usual with your  normal  shampoo.  3.  After you shampoo, rinse your hair and body thoroughly to remove the  shampoo.                           4.  Use CHG as you would any other liquid soap.  You can apply chg directly  to the skin and wash                       Gently with a scrungie or clean washcloth.  5.  Apply the CHG Soap to your body ONLY FROM THE NECK DOWN.   Do not use on face/ open                           Wound or open sores. Avoid contact with eyes, ears mouth and genitals (private parts).  Wash face,  Genitals (private parts) with your normal soap.             6.  Wash thoroughly, paying special attention to the area where your surgery  will be performed.  7.  Thoroughly rinse your body with warm water from the neck down.  8.  DO NOT shower/wash with your normal soap after using and rinsing off  the CHG Soap.                9.  Pat yourself dry with a clean towel.            10.  Wear clean pajamas.            11.  Place clean sheets on your bed the night of your first shower and do not  sleep with pets. Day of Surgery : Do not apply any lotions/deodorants the morning of surgery.  Please wear clean clothes to the hospital/surgery center.  FAILURE TO FOLLOW THESE INSTRUCTIONS MAY RESULT IN THE CANCELLATION OF YOUR SURGERY PATIENT SIGNATURE_________________________________  NURSE SIGNATURE__________________________________  ________________________________________________________________________   Adam Phenix  An incentive spirometer is a tool that can help keep your lungs clear and active. This tool measures how well you are filling your lungs with each breath. Taking long deep breaths may help  reverse or decrease the chance of developing breathing (pulmonary) problems (especially infection) following:  A long period of time when you are unable to move or be active. BEFORE THE PROCEDURE   If the spirometer includes an indicator to show your best effort, your nurse or respiratory therapist will set it to a desired goal.  If possible, sit up straight or lean slightly forward. Try not to slouch.  Hold the incentive spirometer in an upright position. INSTRUCTIONS FOR USE  1. Sit on the edge of your bed if possible, or sit up as far as you can in bed or on a chair. 2. Hold the incentive spirometer in an upright position. 3. Breathe out normally. 4. Place the mouthpiece in your mouth and seal your lips tightly around it. 5. Breathe in slowly and as deeply as possible, raising the piston or the ball toward the top of the column. 6. Hold your breath for 3-5 seconds or for as long as possible. Allow the piston or ball to fall to the bottom of the column. 7. Remove the mouthpiece from your mouth and breathe out normally. 8. Rest for a few seconds and repeat Steps 1 through 7 at least 10 times every 1-2 hours when you are awake. Take your time and take a few normal breaths between deep breaths. 9. The spirometer may include an indicator to show your best effort. Use the indicator as a goal to work toward during each repetition. 10. After each set of 10 deep breaths, practice coughing to be sure your lungs are clear. If you have an incision (the cut made at the time of surgery), support your incision when coughing by placing a pillow or rolled up towels firmly against it. Once you are able to get out of bed, walk around indoors and cough well. You may stop using the incentive spirometer when instructed by your caregiver.  RISKS AND COMPLICATIONS  Take your time so you do not get dizzy or light-headed.  If you are in pain, you may need to take or ask for pain medication before doing incentive  spirometry. It is harder to take a deep breath if you are having  pain. AFTER USE  Rest and breathe slowly and easily.  It can be helpful to keep track of a log of your progress. Your caregiver can provide you with a simple table to help with this. If you are using the spirometer at home, follow these instructions: [place] IF:   You are having difficultly using the spirometer.  You have trouble using the spirometer as often as instructed.  Your pain medication is not giving enough relief while using the spirometer.  You develop fever of 100.5 F (38.1 C) or higher. SEEK IMMEDIATE MEDICAL CARE IF:   You cough up bloody sputum that had not been present before.  You develop fever of 102 F (38.9 C) or greater.  You develop worsening pain at or near the incision site. MAKE SURE YOU:   Understand these instructions.  Will watch your condition.  Will get help right away if you are not doing well or get worse. Document Released: 10/20/2006 Document Revised: 09/01/2011 Document Reviewed: 12/21/2006 [facility] Patient Information 2014 [place], [place].   ________________________________________________________________________

## 2019-05-24 ENCOUNTER — Encounter (INDEPENDENT_AMBULATORY_CARE_PROVIDER_SITE_OTHER): Payer: Self-pay | Admitting: Physician Assistant

## 2019-05-24 ENCOUNTER — Ambulatory Visit (HOSPITAL_COMMUNITY)
Admission: RE | Admit: 2019-05-24 | Discharge: 2019-05-24 | Disposition: A | Discharge status: Home / Self Care | Payer: PPO | Source: Ambulatory Visit | Attending: Orthopedic Surgery | Admitting: Orthopedic Surgery

## 2019-05-24 ENCOUNTER — Encounter (HOSPITAL_COMMUNITY)
Admission: RE | Admit: 2019-05-24 | Discharge: 2019-05-24 | Disposition: A | Discharge status: Home / Self Care | Payer: PPO | Source: Ambulatory Visit | Attending: Orthopedic Surgery | Admitting: Orthopedic Surgery

## 2019-05-24 ENCOUNTER — Ambulatory Visit (INDEPENDENT_AMBULATORY_CARE_PROVIDER_SITE_OTHER): Payer: PPO | Admitting: Physician Assistant

## 2019-05-24 ENCOUNTER — Other Ambulatory Visit (HOSPITAL_COMMUNITY)
Admission: RE | Admit: 2019-05-24 | Discharge: 2019-05-24 | Disposition: A | Discharge status: Home / Self Care | Payer: PPO | Source: Ambulatory Visit | Attending: Orthopedic Surgery | Admitting: Orthopedic Surgery

## 2019-05-24 ENCOUNTER — Encounter (HOSPITAL_COMMUNITY): Payer: Self-pay

## 2019-05-24 ENCOUNTER — Other Ambulatory Visit: Payer: Self-pay

## 2019-05-24 ENCOUNTER — Other Ambulatory Visit (HOSPITAL_COMMUNITY): Payer: BC Managed Care – PPO

## 2019-05-24 VITALS — BP 110/56 | HR 70 | Temp 97.7°F | Ht 63.0 in | Wt 225.0 lb

## 2019-05-24 DIAGNOSIS — Z01811 Encounter for preprocedural respiratory examination: Secondary | ICD-10-CM

## 2019-05-24 DIAGNOSIS — Z6841 Body Mass Index (BMI) 40.0 and over, adult: Secondary | ICD-10-CM | POA: Diagnosis not present

## 2019-05-24 DIAGNOSIS — Z01812 Encounter for preprocedural laboratory examination: Secondary | ICD-10-CM | POA: Insufficient documentation

## 2019-05-24 DIAGNOSIS — E559 Vitamin D deficiency, unspecified: Secondary | ICD-10-CM | POA: Diagnosis not present

## 2019-05-24 DIAGNOSIS — Z96642 Presence of left artificial hip joint: Secondary | ICD-10-CM | POA: Diagnosis not present

## 2019-05-24 DIAGNOSIS — Z01818 Encounter for other preprocedural examination: Secondary | ICD-10-CM | POA: Insufficient documentation

## 2019-05-24 DIAGNOSIS — Z20828 Contact with and (suspected) exposure to other viral communicable diseases: Secondary | ICD-10-CM | POA: Diagnosis not present

## 2019-05-24 HISTORY — DX: Accidental discharge from unspecified firearms or gun, initial encounter: W34.00XA

## 2019-05-24 HISTORY — DX: Unspecified firearm discharge, undetermined intent, initial encounter: Y24.9XXA

## 2019-05-24 LAB — COMPREHENSIVE METABOLIC PANEL
ALT: 31 U/L (ref 0–44)
AST: 26 U/L (ref 15–41)
Albumin: 4.1 g/dL (ref 3.5–5.0)
Alkaline Phosphatase: 84 U/L (ref 38–126)
Anion gap: 9 (ref 5–15)
BUN: 24 mg/dL — ABNORMAL HIGH (ref 8–23)
CO2: 24 mmol/L (ref 22–32)
Calcium: 9.5 mg/dL (ref 8.9–10.3)
Chloride: 102 mmol/L (ref 98–111)
Creatinine, Ser: 0.7 mg/dL (ref 0.44–1.00)
GFR calc Af Amer: >60 mL/min (ref >60)
GFR calc non Af Amer: >60 mL/min (ref >60)
Glucose, Bld: 95 mg/dL (ref 70–99)
Potassium: 4.3 mmol/L (ref 3.5–5.1)
Sodium: 135 mmol/L (ref 135–145)
Total Bilirubin: 0.8 mg/dL (ref 0.3–1.2)
Total Protein: 7.5 g/dL (ref 6.5–8.1)

## 2019-05-24 LAB — CBC WITH DIFFERENTIAL/PLATELET
Abs Immature Granulocytes: 0.02 10*3/uL (ref 0.00–0.07)
Basophils Absolute: 0.1 10*3/uL (ref 0.0–0.1)
Basophils Relative: 1 %
Eosinophils Absolute: 0.1 10*3/uL (ref 0.0–0.5)
Eosinophils Relative: 2 %
HCT: 38.5 % (ref 36.0–46.0)
Hemoglobin: 12.6 g/dL (ref 12.0–15.0)
Immature Granulocytes: 0 %
Lymphocytes Relative: 40 %
Lymphs Abs: 3.5 10*3/uL (ref 0.7–4.0)
MCH: 30.4 pg (ref 26.0–34.0)
MCHC: 32.7 g/dL (ref 30.0–36.0)
MCV: 92.8 fL (ref 80.0–100.0)
Monocytes Absolute: 0.7 10*3/uL (ref 0.1–1.0)
Monocytes Relative: 8 %
Neutro Abs: 4.4 10*3/uL (ref 1.7–7.7)
Neutrophils Relative %: 49 %
Platelets: 256 10*3/uL (ref 150–400)
RBC: 4.15 MIL/uL (ref 3.87–5.11)
RDW: 12.2 % (ref 11.5–15.5)
WBC: 8.9 10*3/uL (ref 4.0–10.5)
nRBC: 0 % (ref 0.0–0.2)

## 2019-05-24 LAB — ABO/RH: ABO/RH(D): O POS

## 2019-05-24 LAB — HEMOGLOBIN A1C
Hgb A1c MFr Bld: 6.1 % — ABNORMAL HIGH (ref 4.8–5.6)
Mean Plasma Glucose: 128.37 mg/dL

## 2019-05-24 LAB — URINALYSIS, ROUTINE W REFLEX MICROSCOPIC
Bilirubin Urine: NEGATIVE
Glucose, UA: NEGATIVE mg/dL
Hgb urine dipstick: NEGATIVE
Ketones, ur: NEGATIVE mg/dL
Leukocytes,Ua: NEGATIVE
Nitrite: NEGATIVE
Protein, ur: NEGATIVE mg/dL
Specific Gravity, Urine: 1.006 (ref 1.005–1.030)
pH: 5 (ref 5.0–8.0)

## 2019-05-24 LAB — PROTIME-INR
INR: 0.9 (ref 0.8–1.2)
Prothrombin Time: 12.5 seconds (ref 11.4–15.2)

## 2019-05-24 LAB — GLUCOSE, CAPILLARY: Glucose-Capillary: 86 mg/dL (ref 70–99)

## 2019-05-24 LAB — SURGICAL PCR SCREEN
MRSA, PCR: POSITIVE — AB
Staphylococcus aureus: POSITIVE — AB

## 2019-05-24 LAB — APTT: aPTT: 29 seconds (ref 24–36)

## 2019-05-24 NOTE — Progress Notes (Signed)
PCP - Nicoletta Dress, MD Cardiologist - Berniece Salines, DO, per cardiology  11-17 tele note "stress test was negative and that we were send [facility] her records and clearance form  Chest x-ray -  EKG - 11-11 epic  Stress Test - see tele note 11-17 cardiology  ECHO -  Cardiac Cath -   Sleep Study -  CPAP -   Fasting Blood Sugar - 90-100 Checks Blood Sugar __2___ times a day  Blood Thinner Instructions: Aspirin Instructions: Last Dose:  Anesthesia review:     Patient denies shortness of breath, fever, cough and chest pain at PAT appointment   Patient verbalized understanding of instructions that were given to them at the PAT appointment. Patient was also instructed that they will need to review over the PAT instructions again at home before surgery.
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
## 2019-05-26 LAB — NOVEL CORONAVIRUS, NAA (HOSP ORDER, SEND-OUT TO REF LAB; TAT 18-24 HRS): SARS-CoV-2, NAA: NOT DETECTED

## 2019-05-26 MED ORDER — BUPIVACAINE LIPOSOME 1.3 % IJ SUSP
10.0000 mL | Freq: Once | INTRAMUSCULAR | Status: DC
  Filled 2019-05-26: qty 10

## 2019-05-26 NOTE — Anesthesia Preprocedure Evaluation (Addendum)
Anesthesia Evaluation  Patient identified by MRN, date of birth, ID band Patient awake    Reviewed: Allergy & Precautions, NPO status , Patient's Chart, lab work & pertinent test results  History of Anesthesia Complications Negative for: history of anesthetic complications  Airway Mallampati: II  TM Distance: >3 FB Neck ROM: Full    Dental no notable dental hx.    Pulmonary sleep apnea and Continuous Positive Airway Pressure Ventilation ,    Pulmonary exam normal        Cardiovascular hypertension, Pt. on medications Normal cardiovascular exam  EKG 05/04/2019: NSR, RBBB   Neuro/Psych negative neurological ROS  negative psych ROS   GI/Hepatic Neg liver ROS, GERD  ,  Endo/Other  diabetes, Type 2Morbid obesity  Renal/GU negative Renal ROS  negative genitourinary   Musculoskeletal  (+) Arthritis , Fibromyalgia -, narcotic dependent  Abdominal   Peds  Hematology negative hematology ROS (+)   Anesthesia Other Findings Day of surgery medications reviewed with patient.  Reproductive/Obstetrics negative OB ROS                            Anesthesia Physical Anesthesia Plan  ASA: III  Anesthesia Plan: Spinal   Post-op Pain Management:    Induction:   PONV Risk Score and Plan: 3 and Treatment may vary due to age or medical condition, Ondansetron, Propofol infusion, Dexamethasone and Midazolam  Airway Management Planned: Natural Airway and Simple Face Mask  Additional Equipment: None  Intra-op Plan:   Post-operative Plan:   Informed Consent: I have reviewed the patients History and Physical, chart, labs and discussed the procedure including the risks, benefits and alternatives for the proposed anesthesia with the patient or authorized representative who has indicated his/her understanding and acceptance.     Dental advisory given  Plan Discussed with: CRNA  Anesthesia Plan  Comments:        Anesthesia Quick Evaluation

## 2019-05-27 ENCOUNTER — Ambulatory Visit (HOSPITAL_COMMUNITY): Payer: PPO | Admitting: Registered Nurse

## 2019-05-27 ENCOUNTER — Ambulatory Visit (HOSPITAL_COMMUNITY): Payer: PPO

## 2019-05-27 ENCOUNTER — Other Ambulatory Visit: Payer: Self-pay

## 2019-05-27 ENCOUNTER — Encounter (HOSPITAL_COMMUNITY)
Admission: RE | Disposition: A | Discharge status: Home / Self Care | Payer: Self-pay | Source: Other Acute Inpatient Hospital | Attending: Orthopedic Surgery

## 2019-05-27 ENCOUNTER — Ambulatory Visit (HOSPITAL_COMMUNITY): Payer: PPO | Admitting: Physician Assistant

## 2019-05-27 ENCOUNTER — Observation Stay (HOSPITAL_COMMUNITY)
Admission: RE | Admit: 2019-05-27 | Discharge: 2019-05-29 | Disposition: A | Discharge status: Home / Self Care | Payer: PPO | Source: Other Acute Inpatient Hospital | Attending: Orthopedic Surgery | Admitting: Orthopedic Surgery

## 2019-05-27 ENCOUNTER — Encounter (HOSPITAL_COMMUNITY): Payer: Self-pay | Admitting: *Deleted

## 2019-05-27 DIAGNOSIS — M1612 Unilateral primary osteoarthritis, left hip: Principal | ICD-10-CM

## 2019-05-27 DIAGNOSIS — Z6839 Body mass index (BMI) 39.0-39.9, adult: Secondary | ICD-10-CM | POA: Diagnosis not present

## 2019-05-27 DIAGNOSIS — G8929 Other chronic pain: Secondary | ICD-10-CM | POA: Insufficient documentation

## 2019-05-27 DIAGNOSIS — E119 Type 2 diabetes mellitus without complications: Secondary | ICD-10-CM | POA: Insufficient documentation

## 2019-05-27 DIAGNOSIS — D62 Acute posthemorrhagic anemia: Secondary | ICD-10-CM | POA: Diagnosis not present

## 2019-05-27 DIAGNOSIS — Z79890 Hormone replacement therapy (postmenopausal): Secondary | ICD-10-CM | POA: Diagnosis not present

## 2019-05-27 DIAGNOSIS — Z471 Aftercare following joint replacement surgery: Secondary | ICD-10-CM | POA: Diagnosis not present

## 2019-05-27 DIAGNOSIS — Z96642 Presence of left artificial hip joint: Secondary | ICD-10-CM | POA: Diagnosis not present

## 2019-05-27 DIAGNOSIS — I10 Essential (primary) hypertension: Secondary | ICD-10-CM | POA: Diagnosis not present

## 2019-05-27 DIAGNOSIS — Z791 Long term (current) use of non-steroidal anti-inflammatories (NSAID): Secondary | ICD-10-CM | POA: Insufficient documentation

## 2019-05-27 DIAGNOSIS — G4730 Sleep apnea, unspecified: Secondary | ICD-10-CM | POA: Insufficient documentation

## 2019-05-27 DIAGNOSIS — K589 Irritable bowel syndrome without diarrhea: Secondary | ICD-10-CM | POA: Diagnosis not present

## 2019-05-27 DIAGNOSIS — E1121 Type 2 diabetes mellitus with diabetic nephropathy: Secondary | ICD-10-CM | POA: Insufficient documentation

## 2019-05-27 DIAGNOSIS — E559 Vitamin D deficiency, unspecified: Secondary | ICD-10-CM | POA: Diagnosis not present

## 2019-05-27 DIAGNOSIS — Z96649 Presence of unspecified artificial hip joint: Secondary | ICD-10-CM

## 2019-05-27 DIAGNOSIS — Z79899 Other long term (current) drug therapy: Secondary | ICD-10-CM | POA: Insufficient documentation

## 2019-05-27 DIAGNOSIS — Z419 Encounter for procedure for purposes other than remedying health state, unspecified: Secondary | ICD-10-CM

## 2019-05-27 DIAGNOSIS — Z85828 Personal history of other malignant neoplasm of skin: Secondary | ICD-10-CM | POA: Insufficient documentation

## 2019-05-27 DIAGNOSIS — K219 Gastro-esophageal reflux disease without esophagitis: Secondary | ICD-10-CM | POA: Insufficient documentation

## 2019-05-27 DIAGNOSIS — M797 Fibromyalgia: Secondary | ICD-10-CM | POA: Diagnosis not present

## 2019-05-27 HISTORY — PX: TOTAL HIP ARTHROPLASTY: SHX124

## 2019-05-27 LAB — GLUCOSE, CAPILLARY
Glucose-Capillary: 100 mg/dL — ABNORMAL HIGH (ref 70–99)
Glucose-Capillary: 148 mg/dL — ABNORMAL HIGH (ref 70–99)
Glucose-Capillary: 174 mg/dL — ABNORMAL HIGH (ref 70–99)
Glucose-Capillary: 174 mg/dL — ABNORMAL HIGH (ref 70–99)

## 2019-05-27 LAB — TYPE AND SCREEN
ABO/RH(D): O POS
Antibody Screen: NEGATIVE

## 2019-05-27 SURGERY — ARTHROPLASTY, HIP, TOTAL, ANTERIOR APPROACH
Anesthesia: Spinal | Site: Hip | Laterality: Left

## 2019-05-27 MED ORDER — PROPOFOL 10 MG/ML IV BOLUS
INTRAVENOUS | Status: DC | PRN
  Administered 2019-05-27: 240 mg via INTRAVENOUS

## 2019-05-27 MED ORDER — MAGNESIUM CITRATE PO SOLN: 1.0000 | Freq: Once | ORAL | Status: DC | PRN

## 2019-05-27 MED ORDER — ROCURONIUM BROMIDE 10 MG/ML (PF) SYRINGE
PREFILLED_SYRINGE | INTRAVENOUS | Status: DC | PRN
  Administered 2019-05-27: 50 mg via INTRAVENOUS

## 2019-05-27 MED ORDER — FENTANYL CITRATE (PF) 100 MCG/2ML IJ SOLN
INTRAMUSCULAR | Status: AC
  Filled 2019-05-27: qty 2

## 2019-05-27 MED ORDER — MIDAZOLAM HCL 2 MG/2ML IJ SOLN
INTRAMUSCULAR | Status: AC
  Filled 2019-05-27: qty 2

## 2019-05-27 MED ORDER — TIZANIDINE HCL 2 MG PO TABS: 2.0000 mg | ORAL_TABLET | Freq: Three times a day (TID) | ORAL | 0 refills | Status: DC | PRN

## 2019-05-27 MED ORDER — METHOCARBAMOL 500 MG IVPB - SIMPLE MED
500.0000 mg | Freq: Four times a day (QID) | INTRAVENOUS | Status: DC | PRN
  Administered 2019-05-27: 500 mg via INTRAVENOUS
  Filled 2019-05-27: qty 50

## 2019-05-27 MED ORDER — PHENYLEPHRINE 40 MCG/ML (10ML) SYRINGE FOR IV PUSH (FOR BLOOD PRESSURE SUPPORT)
PREFILLED_SYRINGE | INTRAVENOUS | Status: DC | PRN
  Administered 2019-05-27: 80 ug via INTRAVENOUS

## 2019-05-27 MED ORDER — ONDANSETRON HCL 4 MG/2ML IJ SOLN
INTRAMUSCULAR | Status: AC
  Filled 2019-05-27: qty 2

## 2019-05-27 MED ORDER — LISINOPRIL 20 MG PO TABS
20.0000 mg | ORAL_TABLET | Freq: Every day | ORAL | Status: DC
  Administered 2019-05-28: 20 mg via ORAL
  Filled 2019-05-27 (×2): qty 1

## 2019-05-27 MED ORDER — DOCUSATE SODIUM 100 MG PO CAPS: 100.0000 mg | ORAL_CAPSULE | Freq: Two times a day (BID) | ORAL | 0 refills | Status: DC

## 2019-05-27 MED ORDER — WATER FOR IRRIGATION, STERILE IR SOLN
Status: DC | PRN
  Administered 2019-05-27: 2000 mL

## 2019-05-27 MED ORDER — SPIRONOLACTONE 12.5 MG HALF TABLET
12.5000 mg | ORAL_TABLET | Freq: Every day | ORAL | Status: DC
  Administered 2019-05-29: 12.5 mg via ORAL
  Filled 2019-05-27 (×2): qty 1

## 2019-05-27 MED ORDER — EPHEDRINE SULFATE-NACL 50-0.9 MG/10ML-% IV SOSY
PREFILLED_SYRINGE | INTRAVENOUS | Status: DC | PRN
  Administered 2019-05-27 (×2): 5 mg via INTRAVENOUS

## 2019-05-27 MED ORDER — ONDANSETRON HCL 4 MG PO TABS: 4.0000 mg | ORAL_TABLET | Freq: Four times a day (QID) | ORAL | Status: DC | PRN

## 2019-05-27 MED ORDER — PHENYLEPHRINE HCL-NACL 20-0.9 MG/250ML-% IV SOLN
INTRAVENOUS | Status: DC | PRN
  Administered 2019-05-27: 20 ug/min via INTRAVENOUS

## 2019-05-27 MED ORDER — MIDAZOLAM HCL 5 MG/5ML IJ SOLN
INTRAMUSCULAR | Status: DC | PRN
  Administered 2019-05-27: 2 mg via INTRAVENOUS

## 2019-05-27 MED ORDER — LIDOCAINE 2% (20 MG/ML) 5 ML SYRINGE
INTRAMUSCULAR | Status: AC
  Filled 2019-05-27: qty 5

## 2019-05-27 MED ORDER — PHENYLEPHRINE HCL (PRESSORS) 10 MG/ML IV SOLN
INTRAVENOUS | Status: AC
  Filled 2019-05-27: qty 2

## 2019-05-27 MED ORDER — GABAPENTIN 300 MG PO CAPS
300.0000 mg | ORAL_CAPSULE | Freq: Two times a day (BID) | ORAL | Status: DC
  Administered 2019-05-27 – 2019-05-29 (×4): 300 mg via ORAL
  Filled 2019-05-27 (×5): qty 1

## 2019-05-27 MED ORDER — OXYCODONE HCL 5 MG PO TABS: 5.0000 mg | ORAL_TABLET | Freq: Once | ORAL | Status: DC | PRN

## 2019-05-27 MED ORDER — POVIDONE-IODINE 10 % EX SWAB
2.0000 "application " | Freq: Once | CUTANEOUS | Status: AC
  Administered 2019-05-27: 2 via TOPICAL

## 2019-05-27 MED ORDER — LACTATED RINGERS IV SOLN
INTRAVENOUS | Status: DC
  Administered 2019-05-27: 07:00:00 via INTRAVENOUS

## 2019-05-27 MED ORDER — HYDROMORPHONE HCL 1 MG/ML IJ SOLN
0.5000 mg | INTRAMUSCULAR | Status: DC | PRN
  Administered 2019-05-27: 16:00:00 0.5 mg via INTRAVENOUS
  Administered 2019-05-27 – 2019-05-28 (×3): 1 mg via INTRAVENOUS
  Filled 2019-05-27 (×4): qty 1

## 2019-05-27 MED ORDER — SODIUM CHLORIDE 0.9 % IV SOLN
INTRAVENOUS | Status: DC
  Administered 2019-05-27: 100 mL/h via INTRAVENOUS
  Administered 2019-05-27: 20:00:00 via INTRAVENOUS

## 2019-05-27 MED ORDER — EPHEDRINE 5 MG/ML INJ
INTRAVENOUS | Status: AC
  Filled 2019-05-27: qty 10

## 2019-05-27 MED ORDER — OXYCODONE HCL 5 MG/5ML PO SOLN: 5.0000 mg | Freq: Once | ORAL | Status: DC | PRN

## 2019-05-27 MED ORDER — ACETAMINOPHEN 500 MG PO TABS
1000.0000 mg | ORAL_TABLET | Freq: Once | ORAL | Status: AC
  Administered 2019-05-27: 1000 mg via ORAL
  Filled 2019-05-27: qty 2

## 2019-05-27 MED ORDER — VANCOMYCIN HCL IN DEXTROSE 1-5 GM/200ML-% IV SOLN
1000.0000 mg | Freq: Two times a day (BID) | INTRAVENOUS | Status: AC
  Administered 2019-05-27: 1000 mg via INTRAVENOUS
  Filled 2019-05-27: qty 200

## 2019-05-27 MED ORDER — BUPIVACAINE HCL (PF) 0.25 % IJ SOLN
INTRAMUSCULAR | Status: DC | PRN
  Administered 2019-05-27: 30 mL

## 2019-05-27 MED ORDER — ALUM & MAG HYDROXIDE-SIMETH 200-200-20 MG/5ML PO SUSP: 30.0000 mL | ORAL | Status: DC | PRN

## 2019-05-27 MED ORDER — INSULIN ASPART 100 UNIT/ML ~~LOC~~ SOLN
0.0000 [IU] | Freq: Three times a day (TID) | SUBCUTANEOUS | Status: DC
  Administered 2019-05-27 – 2019-05-28 (×2): 3 [IU] via SUBCUTANEOUS
  Administered 2019-05-28 – 2019-05-29 (×2): 2 [IU] via SUBCUTANEOUS

## 2019-05-27 MED ORDER — SITAGLIPTIN PHOS-METFORMIN HCL 50-1000 MG PO TABS: 1.0000 | ORAL_TABLET | Freq: Two times a day (BID) | ORAL | Status: DC

## 2019-05-27 MED ORDER — DIPHENHYDRAMINE HCL 12.5 MG/5ML PO ELIX: 12.5000 mg | ORAL_SOLUTION | ORAL | Status: DC | PRN

## 2019-05-27 MED ORDER — ONDANSETRON HCL 4 MG/2ML IJ SOLN
INTRAMUSCULAR | Status: DC | PRN
  Administered 2019-05-27: 4 mg via INTRAVENOUS

## 2019-05-27 MED ORDER — METHOCARBAMOL 500 MG PO TABS
500.0000 mg | ORAL_TABLET | Freq: Four times a day (QID) | ORAL | Status: DC | PRN
  Administered 2019-05-27 – 2019-05-29 (×5): 500 mg via ORAL
  Filled 2019-05-27 (×5): qty 1

## 2019-05-27 MED ORDER — SUCCINYLCHOLINE CHLORIDE 200 MG/10ML IV SOSY
PREFILLED_SYRINGE | INTRAVENOUS | Status: AC
  Filled 2019-05-27: qty 10

## 2019-05-27 MED ORDER — ROCURONIUM BROMIDE 10 MG/ML (PF) SYRINGE
PREFILLED_SYRINGE | INTRAVENOUS | Status: AC
  Filled 2019-05-27: qty 10

## 2019-05-27 MED ORDER — ONDANSETRON HCL 4 MG/2ML IJ SOLN: 4.0000 mg | Freq: Four times a day (QID) | INTRAMUSCULAR | Status: DC | PRN

## 2019-05-27 MED ORDER — CEFAZOLIN SODIUM-DEXTROSE 2-4 GM/100ML-% IV SOLN
2.0000 g | INTRAVENOUS | Status: AC
  Administered 2019-05-27: 2 g via INTRAVENOUS

## 2019-05-27 MED ORDER — ASPIRIN EC 325 MG PO TBEC
325.0000 mg | DELAYED_RELEASE_TABLET | Freq: Two times a day (BID) | ORAL | Status: DC
  Administered 2019-05-27 – 2019-05-29 (×4): 325 mg via ORAL
  Filled 2019-05-27 (×4): qty 1

## 2019-05-27 MED ORDER — DEXAMETHASONE SODIUM PHOSPHATE 10 MG/ML IJ SOLN
10.0000 mg | Freq: Two times a day (BID) | INTRAMUSCULAR | Status: AC
  Administered 2019-05-28 (×2): 10 mg via INTRAVENOUS
  Filled 2019-05-27 (×2): qty 1

## 2019-05-27 MED ORDER — PROMETHAZINE HCL 25 MG/ML IJ SOLN: 6.2500 mg | INTRAMUSCULAR | Status: DC | PRN

## 2019-05-27 MED ORDER — HYDROMORPHONE HCL 1 MG/ML IJ SOLN
INTRAMUSCULAR | Status: AC
  Administered 2019-05-27: 0.5 mg via INTRAVENOUS
  Filled 2019-05-27: qty 2

## 2019-05-27 MED ORDER — ACETAMINOPHEN 325 MG PO TABS
325.0000 mg | ORAL_TABLET | Freq: Four times a day (QID) | ORAL | Status: DC | PRN
  Administered 2019-05-27 – 2019-05-28 (×2): 650 mg via ORAL
  Filled 2019-05-27 (×2): qty 2

## 2019-05-27 MED ORDER — POLYETHYLENE GLYCOL 3350 17 G PO PACK: 17.0000 g | PACK | Freq: Every day | ORAL | Status: DC | PRN

## 2019-05-27 MED ORDER — BISACODYL 5 MG PO TBEC: 5.0000 mg | DELAYED_RELEASE_TABLET | Freq: Every day | ORAL | Status: DC | PRN

## 2019-05-27 MED ORDER — CHLORHEXIDINE GLUCONATE 4 % EX LIQD: 60.0000 mL | Freq: Once | CUTANEOUS | Status: DC

## 2019-05-27 MED ORDER — SUCCINYLCHOLINE CHLORIDE 200 MG/10ML IV SOSY
PREFILLED_SYRINGE | INTRAVENOUS | Status: DC | PRN
  Administered 2019-05-27: 140 mg via INTRAVENOUS

## 2019-05-27 MED ORDER — HYDROMORPHONE HCL 1 MG/ML IJ SOLN
INTRAMUSCULAR | Status: DC | PRN
  Administered 2019-05-27: 0.5 mg via INTRAVENOUS

## 2019-05-27 MED ORDER — METHOCARBAMOL 500 MG IVPB - SIMPLE MED
INTRAVENOUS | Status: AC
  Filled 2019-05-27: qty 50

## 2019-05-27 MED ORDER — PROPOFOL 500 MG/50ML IV EMUL
INTRAVENOUS | Status: DC | PRN
  Administered 2019-05-27: 50 ug/kg/min via INTRAVENOUS

## 2019-05-27 MED ORDER — SODIUM CHLORIDE 0.9 % IR SOLN
Status: DC | PRN
  Administered 2019-05-27: 1000 mL

## 2019-05-27 MED ORDER — OXYCODONE HCL 5 MG PO TABS
5.0000 mg | ORAL_TABLET | ORAL | Status: DC | PRN
  Administered 2019-05-27 (×2): 10 mg via ORAL
  Administered 2019-05-27 (×2): 5 mg via ORAL
  Administered 2019-05-28 – 2019-05-29 (×5): 10 mg via ORAL
  Filled 2019-05-27 (×2): qty 1
  Filled 2019-05-27 (×7): qty 2

## 2019-05-27 MED ORDER — PHENYLEPHRINE 40 MCG/ML (10ML) SYRINGE FOR IV PUSH (FOR BLOOD PRESSURE SUPPORT)
PREFILLED_SYRINGE | INTRAVENOUS | Status: AC
  Filled 2019-05-27: qty 10

## 2019-05-27 MED ORDER — ASPIRIN EC 325 MG PO TBEC: 325.0000 mg | DELAYED_RELEASE_TABLET | Freq: Two times a day (BID) | ORAL | 0 refills | Status: DC

## 2019-05-27 MED ORDER — FENTANYL CITRATE (PF) 100 MCG/2ML IJ SOLN
INTRAMUSCULAR | Status: DC | PRN
  Administered 2019-05-27 (×4): 50 ug via INTRAVENOUS

## 2019-05-27 MED ORDER — LINAGLIPTIN 5 MG PO TABS
5.0000 mg | ORAL_TABLET | Freq: Every day | ORAL | Status: DC
  Administered 2019-05-28 – 2019-05-29 (×2): 5 mg via ORAL
  Filled 2019-05-27 (×2): qty 1

## 2019-05-27 MED ORDER — BUPIVACAINE HCL (PF) 0.25 % IJ SOLN
INTRAMUSCULAR | Status: AC
  Filled 2019-05-27: qty 30

## 2019-05-27 MED ORDER — HYDROMORPHONE HCL 2 MG/ML IJ SOLN
INTRAMUSCULAR | Status: AC
  Filled 2019-05-27: qty 1

## 2019-05-27 MED ORDER — HYDROMORPHONE HCL 1 MG/ML IJ SOLN
0.2500 mg | INTRAMUSCULAR | Status: DC | PRN
  Administered 2019-05-27 (×4): 0.5 mg via INTRAVENOUS

## 2019-05-27 MED ORDER — DEXAMETHASONE SODIUM PHOSPHATE 10 MG/ML IJ SOLN
INTRAMUSCULAR | Status: DC | PRN
  Administered 2019-05-27: 8 mg via INTRAVENOUS

## 2019-05-27 MED ORDER — ESTRADIOL 1 MG PO TABS
0.5000 mg | ORAL_TABLET | ORAL | Status: DC
  Administered 2019-05-28: 0.5 mg via ORAL
  Filled 2019-05-27: qty 0.5

## 2019-05-27 MED ORDER — DEXAMETHASONE SODIUM PHOSPHATE 10 MG/ML IJ SOLN
INTRAMUSCULAR | Status: AC
  Filled 2019-05-27: qty 1

## 2019-05-27 MED ORDER — TRANEXAMIC ACID-NACL 1000-0.7 MG/100ML-% IV SOLN
1000.0000 mg | INTRAVENOUS | Status: AC
  Administered 2019-05-27: 1000 mg via INTRAVENOUS
  Filled 2019-05-27: qty 100

## 2019-05-27 MED ORDER — DICYCLOMINE HCL 20 MG PO TABS
20.0000 mg | ORAL_TABLET | Freq: Every day | ORAL | Status: DC | PRN
  Filled 2019-05-27: qty 1

## 2019-05-27 MED ORDER — BUPIVACAINE LIPOSOME 1.3 % IJ SUSP
INTRAMUSCULAR | Status: DC | PRN
  Administered 2019-05-27: 10 mL

## 2019-05-27 MED ORDER — VANCOMYCIN HCL IN DEXTROSE 1-5 GM/200ML-% IV SOLN
INTRAVENOUS | Status: AC
  Filled 2019-05-27: qty 200

## 2019-05-27 MED ORDER — OXYCODONE-ACETAMINOPHEN 5-325 MG PO TABS: 1.0000 | ORAL_TABLET | Freq: Four times a day (QID) | ORAL | 0 refills | Status: DC | PRN

## 2019-05-27 MED ORDER — DOCUSATE SODIUM 100 MG PO CAPS
100.0000 mg | ORAL_CAPSULE | Freq: Two times a day (BID) | ORAL | Status: DC
  Administered 2019-05-27 – 2019-05-29 (×2): 100 mg via ORAL
  Filled 2019-05-27 (×4): qty 1

## 2019-05-27 MED ORDER — METFORMIN HCL 500 MG PO TABS
1000.0000 mg | ORAL_TABLET | Freq: Two times a day (BID) | ORAL | Status: DC
  Administered 2019-05-28 – 2019-05-29 (×3): 1000 mg via ORAL
  Filled 2019-05-27 (×3): qty 2

## 2019-05-27 MED ORDER — PROPOFOL 500 MG/50ML IV EMUL
INTRAVENOUS | Status: AC
  Filled 2019-05-27: qty 50

## 2019-05-27 MED ORDER — TRANEXAMIC ACID-NACL 1000-0.7 MG/100ML-% IV SOLN
1000.0000 mg | Freq: Once | INTRAVENOUS | Status: AC
  Administered 2019-05-27: 1000 mg via INTRAVENOUS
  Filled 2019-05-27: qty 100

## 2019-05-27 MED ORDER — PANTOPRAZOLE SODIUM 40 MG PO TBEC
80.0000 mg | DELAYED_RELEASE_TABLET | Freq: Every day | ORAL | Status: DC
  Administered 2019-05-28 – 2019-05-29 (×2): 80 mg via ORAL
  Filled 2019-05-27 (×2): qty 2

## 2019-05-27 MED ORDER — SUGAMMADEX SODIUM 200 MG/2ML IV SOLN
INTRAVENOUS | Status: DC | PRN
  Administered 2019-05-27: 200 mg via INTRAVENOUS

## 2019-05-27 MED ORDER — VANCOMYCIN HCL 1000 MG IV SOLR
INTRAVENOUS | Status: DC | PRN
  Administered 2019-05-27: 1000 mg via INTRAVENOUS

## 2019-05-27 SURGICAL SUPPLY — 47 items
APL SKNCLS STERI-STRIP NONHPOA (GAUZE/BANDAGES/DRESSINGS)
BAG SPEC THK2 15X12 ZIP CLS (MISCELLANEOUS)
BAG ZIPLOCK 12X15 (MISCELLANEOUS) IMPLANT
BENZOIN TINCTURE PRP APPL 2/3 (GAUZE/BANDAGES/DRESSINGS) IMPLANT
BLADE SAW SGTL 18X1.27X75 (BLADE) ×2 IMPLANT
BLADE SAW SGTL 18X1.27X75MM (BLADE) ×1
BLADE SURG SZ10 CARB STEEL (BLADE) ×6 IMPLANT
CLOSURE WOUND 1/2 X4 (GAUZE/BANDAGES/DRESSINGS)
COLLAR OFFSET CORAIL SZ 10 HIP (Stem) IMPLANT
CORAIL OFFSET COLLAR SZ 10 HIP (Stem) ×3 IMPLANT
COVER PERINEAL POST (MISCELLANEOUS) ×3 IMPLANT
COVER SURGICAL LIGHT HANDLE (MISCELLANEOUS) ×3 IMPLANT
COVER WAND RF STERILE (DRAPES) ×2 IMPLANT
CUP ACETBLR 54 OD 100 SERIES (Hips) ×2 IMPLANT
DECANTER SPIKE VIAL GLASS SM (MISCELLANEOUS) ×1 IMPLANT
DRAPE STERI IOBAN 125X83 (DRAPES) ×3 IMPLANT
DRAPE U-SHAPE 47X51 STRL (DRAPES) ×6 IMPLANT
DRSG AQUACEL AG ADV 3.5X 6 (GAUZE/BANDAGES/DRESSINGS) ×3 IMPLANT
DRSG AQUACEL AG ADV 3.5X10 (GAUZE/BANDAGES/DRESSINGS) ×2 IMPLANT
DRSG CURAD 3X16 NADH (PACKING) ×2 IMPLANT
DURAPREP 26ML APPLICATOR (WOUND CARE) ×3 IMPLANT
ELECT BLADE TIP CTD 4 INCH (ELECTRODE) ×3 IMPLANT
ELECT REM PT RETURN 15FT ADLT (MISCELLANEOUS) ×3 IMPLANT
ELIMINATOR HOLE APEX DEPUY (Hips) ×2 IMPLANT
GAUZE XEROFORM 1X8 LF (GAUZE/BANDAGES/DRESSINGS) ×2 IMPLANT
GLOVE BIOGEL PI IND STRL 8 (GLOVE) ×2 IMPLANT
GLOVE BIOGEL PI INDICATOR 8 (GLOVE) ×4
GLOVE ECLIPSE 7.5 STRL STRAW (GLOVE) ×6 IMPLANT
GOWN STRL REUS W/TWL XL LVL3 (GOWN DISPOSABLE) ×6 IMPLANT
HEAD CERAMIC BIO DELTA 36 (Hips) ×2 IMPLANT
HOLDER FOLEY CATH W/STRAP (MISCELLANEOUS) ×3 IMPLANT
HOOD PEEL AWAY FLYTE STAYCOOL (MISCELLANEOUS) ×6 IMPLANT
KIT TURNOVER KIT A (KITS) IMPLANT
LINER NEUTRAL 36ID 54OD (Liner) ×2 IMPLANT
NEEDLE HYPO 22GX1.5 SAFETY (NEEDLE) ×3 IMPLANT
PACK ANTERIOR HIP CUSTOM (KITS) ×3 IMPLANT
PENCIL SMOKE EVACUATOR (MISCELLANEOUS) ×2 IMPLANT
STAPLER VISISTAT 35W (STAPLE) ×2 IMPLANT
STRIP CLOSURE SKIN 1/2X4 (GAUZE/BANDAGES/DRESSINGS) IMPLANT
SUT ETHIBOND NAB CT1 #1 30IN (SUTURE) ×6 IMPLANT
SUT MNCRL AB 3-0 PS2 18 (SUTURE) IMPLANT
SUT VIC AB 0 CT1 36 (SUTURE) ×5 IMPLANT
SUT VIC AB 1 CT1 36 (SUTURE) ×3 IMPLANT
SUT VIC AB 2-0 CT1 27 (SUTURE) ×3
SUT VIC AB 2-0 CT1 TAPERPNT 27 (SUTURE) IMPLANT
TRAY FOLEY MTR SLVR 14FR STAT (SET/KITS/TRAYS/PACK) ×2 IMPLANT
YANKAUER SUCT BULB TIP 10FT TU (MISCELLANEOUS) ×3 IMPLANT

## 2019-05-27 NOTE — Evaluation (Signed)
Physical Therapy Evaluation Patient Details Name: Sydney Leach MRN: [id number] DOB: 1956/03/20 Today's Date: 05/27/2019   History of Present Illness  Patient is 63 y.o. female s/p Lt THA anterior approach with PMH significant for DM, HTN, obesity, OA, GERD, fibrmyalgia, and brights disease.    Clinical Impression  KEIRAN SANDVIK is a 63 y.o. female POD 0 s/p Lt THA anterior approach. Patient reports modified independence with SPC for mobility at baseline. Patient is now limited by functional impairments (see PT problem list below). She was limited greatly by pain with bed mobility and was unable to progress to sit EOB or transfers and gait. Patient instructed in exercise to facilitate ROM and circulation. Patient will benefit from continued skilled PT interventions to address impairments and progress towards PLOF. Acute PT will follow to progress mobility and stair training in preparation for safe discharge home.    Follow Up Recommendations SNF(at current level Pt would require SNF; will update if pt progressed functional mobility in next 1-2 days to be able to return home)    Equipment Recommendations  Rolling walker with 5" wheels;3in1 (PT)    Recommendations for Other Services       Precautions / Restrictions Precautions Precautions: Fall Restrictions Weight Bearing Restrictions: No      Mobility  Bed Mobility Overal bed mobility: Needs Assistance Bed Mobility: Rolling Rolling: Max assist;Total assist         General bed mobility comments: pt required max assist with verbal and visual cues for Rt UE reaching to attempt to roll/pivot to transfer supine to sit. pt able to progress towards EOB however upon Lt knee flexion as her LE came off EOB pt yelled in pain and pulled her Lt LE back onto the bed. unable to perform further mobility due to pain and pt reporting severe cramping in Lt LE.  Transfers         Ambulation/Gait         Stairs     Wheelchair  Mobility    Modified Rankin (Stroke Patients Only)             Pertinent Vitals/Pain Pain Assessment: Faces Faces Pain Scale: Hurts worst Pain Location: Lt hip Pain Descriptors / Indicators: Moaning;Grimacing;Guarding Pain Intervention(s): Limited activity within patient's tolerance;Monitored during session;Repositioned    Home Living Family/patient expects to be discharged to:: Private residence Living Arrangements: Spouse/significant other Available Help at Discharge: Family Type of Home: House Home Access: Stairs to enter Entrance Stairs-Rails: None Entrance Stairs-Number of Steps: 1+1 (stone walkway to get to door) Home Layout: One level Home Equipment: Environmental consultant - 4 wheels      Prior Function Level of Independence: Independent with assistive device(s)               Hand Dominance        Extremity/Trunk Assessment   Upper Extremity Assessment Upper Extremity Assessment: Overall WFL for tasks assessed    Lower Extremity Assessment Lower Extremity Assessment: LLE deficits/detail;RLE deficits/detail RLE: Unable to fully assess due to pain LLE Deficits / Details: unable to assess due to pain, pt unable to perform active hip abduction LLE: Unable to fully assess due to pain       Communication   Communication: No difficulties  Cognition Arousal/Alertness: Awake/alert Behavior During Therapy: Anxious Overall Cognitive Status: Within Functional Limits for tasks assessed             General Comments      Exercises Total Joint Exercises Ankle Circles/Pumps: AROM;10 reps;Supine;Both  Heel Slides: 5 reps;Supine;Left;AAROM   Assessment/Plan    PT Assessment Patient needs continued PT services  PT Problem List Decreased strength;Decreased mobility;Decreased range of motion;Decreased balance;Decreased activity tolerance;Decreased knowledge of use of DME;Obesity;Pain       PT Treatment Interventions DME instruction;Functional mobility  training;Balance training;Patient/family education;Gait training;Therapeutic activities;Therapeutic exercise;Stair training    PT Goals (Current goals can be found in the Care Plan section)  Acute Rehab PT Goals Patient Stated Goal: none stated by patient PT Goal Formulation: With patient Time For Goal Achievement: 06/03/19 Potential to Achieve Goals: Fair    Frequency 7X/week    AM-PAC PT "6 Clicks" Mobility  Outcome Measure Help needed turning from your back to your side while in a flat bed without using bedrails?: A Lot Help needed moving from lying on your back to sitting on the side of a flat bed without using bedrails?: Total Help needed moving to and from a bed to a chair (including a wheelchair)?: Total Help needed standing up from a chair using your arms (e.g., wheelchair or bedside chair)?: Total Help needed to walk in hospital room?: Total Help needed climbing 3-5 steps with a railing? : Total 6 Click Score: 7    End of Session   Activity Tolerance: Patient tolerated treatment well Patient left: in bed;with call bell/phone within reach;with family/visitor present;with bed alarm set Nurse Communication: Mobility status;Patient requests pain meds PT Visit Diagnosis: Muscle weakness (generalized) (M62.81);Difficulty in walking, not elsewhere classified (R26.2);Pain Pain - Right/Left: Left Pain - part of body: Hip    Time: 1434-1500 PT Time Calculation (min) (ACUTE ONLY): 26 min   Charges:   PT Evaluation $PT Eval Low Complexity: 1 Low          Kipp Brood, PT, DPT Physical Therapist with [facility]  05/27/2019 3:35 PM
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
## 2019-05-27 NOTE — Discharge Instructions (Signed)

## 2019-05-27 NOTE — Op Note (Signed)
NAME: Sydney Leach, Sydney Leach MEDICAL RECORD [id number] ACCOUNT [id number] DATE OF BIRTH:August 13, 1955 FACILITY: [facility] LOCATION: [place], [place]  OPERATIVE REPORT  DATE OF PROCEDURE:  05/27/2019  PREOPERATIVE DIAGNOSIS:  End-stage degenerative joint disease, left hip.  POSTOPERATIVE DIAGNOSIS:  End-stage degenerative joint disease, left hip.  PROCEDURE:   1.  Left total hip replacement with a Corail stem size 10 high offset, 54 mm porous coated Pinnacle cup, a neutral liner, and a +12 delta ceramic hip ball 36 mm. 2.  Interpretation of multiple intraoperative fluoroscopic images.  SURGEON:  Dorna Leitz, MD  ASSISTANT:  Gaspar Skeeters PA-C, was present for the entire case and assisted by manipulation of the leg, retraction of tissues, and closing to minimize OR time.  BRIEF HISTORY:  The patient is a 63 year old female with a long history of significant complaints of left hip pain.  She had been treated conservatively for a prolonged period of time.  She initially had elevated BMI greater than 40.  We got her into a  program to get her BMI down below 40.  Once she was optimized for surgery, she was taken to the operating room for left total hip replacement.  DESCRIPTION OF PROCEDURE:  The patient was taken to the operating room after adequate anesthesia obtained with a general anesthetic, placed supine on the operating table.  She was then moved onto the Hana bed and all bony prominences well padded.   Attention turned to the left hip where after routine prep and drape an incision was made for an anterior approach to the hip.  Subcutaneous tissue to the level of the tensor fascia, which was identified and divided in line with its muscle fibers.   Retractor put in place above and below the neck and then a capsular rent was then made.  Sutures were placed in the capsule above and below and retractors were replaced and a provisional neck cut was made.  At this point, retractors  were put in place  above and below the acetabulum and a labrectomy was performed.  Acetabulum was sequentially reamed to a level of 51 mm.  A 52 mm porous coated Pinnacle cup was hammered into place, 45 degrees of lateral opening, 30 degrees of anteversion.  Following  this, attention was turned to the stem side where the hip was externally rotated, adducted and extended and once this was done, the hip was opened with a cookie cutter followed by a chili pepper followed by sequential reaming up to a level of 9 mm.  A  provisional reduction was undertaken.  At this point, it was clear that we left her a little bit short and the neck cut was a little short.  At that point, I went up to a 10 and left the stem proud and then used a 12 ball and this got [place] perfect length  and stability.  I also used a high offset stem just to add to that stability as well.  We trialed this construct and with perfect stability with stability testing and length was almost symmetric.  At this point, the hip was dislocated.  The final size 10  stem was placed and left slightly proud but in an excellent fit and fill.  The 12 ball was then placed and then reduced and final images were taken.  At this point, the capsule was closed with #1 Vicryl running.  The tensor fascia was closed with 0  Vicryl running.  The skin was closed with 0 Vicryl,  2-0 Vicryl and 3-0 Monocryl subcuticular.  Benzoin and Steri-Strips were applied.  Sterile compressive dressing was applied and the patient was taken to recovery was noted to be in satisfactory  condition.  Estimated blood loss for procedure was approximately 250 mL but the final can be gotten from the anesthetic record.  Gaspar Skeeters was present for the entire case and assisted by manipulation of the leg, retraction of tissues, and closing to  minimize OR time.  TN/NUANCE  D:05/27/2019 T:05/27/2019 JOB:[id number]

## 2019-05-27 NOTE — Plan of Care (Signed)
Continue current POC 

## 2019-05-27 NOTE — Transfer of Care (Signed)
Immediate Anesthesia Transfer of Care Note  Patient: TERESHA LACORTE  Procedure(s) Performed: TOTAL HIP ARTHROPLASTY ANTERIOR APPROACH (Left Hip)  Patient Location: PACU  Anesthesia Type:General  Level of Consciousness: awake, alert , oriented and patient cooperative  Airway & Oxygen Therapy: Patient Spontanous Breathing and Patient connected to face mask oxygen  Post-op Assessment: Report given to RN, Post -op Vital signs reviewed and stable and Patient moving all extremities  Post vital signs: Reviewed and stable  Last Vitals:  Vitals Value Taken Time  BP 97/52 05/27/19 0945  Temp    Pulse 92 05/27/19 0947  Resp 16 05/27/19 0947  SpO2 100 % 05/27/19 0947  Vitals shown include unvalidated device data.  Last Pain:  Vitals:   05/27/19 0619  TempSrc:   PainSc: 0-No pain      Patients Stated Pain Goal: 3 ([id number])  Complications: No apparent anesthesia complications

## 2019-05-27 NOTE — Brief Op Note (Signed)
05/27/2019  9:20 AM  PATIENT:  Sydney Leach  63 y.o. female  PRE-OPERATIVE DIAGNOSIS:  LEFT HIP OSTEOARTHRITIS  POST-OPERATIVE DIAGNOSIS:  LEFT HIP OSTEOARTHRITIS  PROCEDURE:  Procedure(s): TOTAL HIP ARTHROPLASTY ANTERIOR APPROACH (Left)  SURGEON:  Surgeon(s) and Role:    Dorna Leitz, MD - Primary  PHYSICIAN ASSISTANT:   ASSISTANTS: Gaspar Skeeters   ANESTHESIA:   general  EBL:  250 mL   BLOOD ADMINISTERED:none  DRAINS: none   LOCAL MEDICATIONS USED:  MARCAINE    and OTHER experel  SPECIMEN:  No Specimen  DISPOSITION OF SPECIMEN:  N/A  COUNTS:  YES  TOURNIQUET:  * No tourniquets in log *  DICTATION: .Other Dictation: Dictation Number [id number]  PLAN OF CARE: Admit to inpatient   PATIENT DISPOSITION:  PACU - hemodynamically stable.   Delay start of Pharmacological VTE agent (>24hrs) due to surgical blood loss or risk of bleeding: no

## 2019-05-27 NOTE — Anesthesia Postprocedure Evaluation (Signed)
Anesthesia Post Note  Patient: [name]  Procedure(s) Performed: TOTAL HIP ARTHROPLASTY ANTERIOR APPROACH (Left Hip)     Patient location during evaluation: PACU Anesthesia Type: General Level of consciousness: awake and alert and oriented Pain management: pain level controlled Vital Signs Assessment: post-procedure vital signs reviewed and stable Respiratory status: spontaneous breathing, nonlabored ventilation and respiratory function stable Cardiovascular status: blood pressure returned to baseline Postop Assessment: no apparent nausea or vomiting Anesthetic complications: no                  Sydney Leach

## 2019-05-27 NOTE — Anesthesia Procedure Notes (Signed)
Procedure Name: Intubation Date/Time: 05/27/2019 7:41 AM Performed by: Victoriano Lain, CRNA Pre-anesthesia Checklist: Patient identified, Emergency Drugs available, Suction available, Patient being monitored and Timeout performed Patient Re-evaluated:Patient Re-evaluated prior to induction Oxygen Delivery Method: Circle system utilized Preoxygenation: Pre-oxygenation with 100% oxygen Induction Type: IV induction Ventilation: Mask ventilation without difficulty Laryngoscope Size: Mac and 4 Grade View: Grade I Tube type: Oral Tube size: 7.5 mm Number of attempts: 1 Airway Equipment and Method: Stylet Placement Confirmation: ETT inserted through vocal cords under direct vision,  positive ETCO2 and breath sounds checked- equal and bilateral Secured at: 21 cm Tube secured with: Tape Dental Injury: Teeth and Oropharynx as per pre-operative assessment  Comments: Larynx appeared to deviated to the left. ATOI.

## 2019-05-28 DIAGNOSIS — M1612 Unilateral primary osteoarthritis, left hip: Secondary | ICD-10-CM | POA: Diagnosis not present

## 2019-05-28 LAB — CBC
HCT: 29.6 % — ABNORMAL LOW (ref 36.0–46.0)
Hemoglobin: 9.9 g/dL — ABNORMAL LOW (ref 12.0–15.0)
MCH: 30.9 pg (ref 26.0–34.0)
MCHC: 33.4 g/dL (ref 30.0–36.0)
MCV: 92.5 fL (ref 80.0–100.0)
Platelets: 192 10*3/uL (ref 150–400)
RBC: 3.2 MIL/uL — ABNORMAL LOW (ref 3.87–5.11)
RDW: 11.9 % (ref 11.5–15.5)
WBC: 9.2 10*3/uL (ref 4.0–10.5)
nRBC: 0 % (ref 0.0–0.2)

## 2019-05-28 LAB — GLUCOSE, CAPILLARY
Glucose-Capillary: 115 mg/dL — ABNORMAL HIGH (ref 70–99)
Glucose-Capillary: 146 mg/dL — ABNORMAL HIGH (ref 70–99)
Glucose-Capillary: 175 mg/dL — ABNORMAL HIGH (ref 70–99)
Glucose-Capillary: 165 mg/dL — ABNORMAL HIGH (ref 70–99)

## 2019-05-28 MED ORDER — CHLORHEXIDINE GLUCONATE CLOTH 2 % EX PADS: 6.0000 | MEDICATED_PAD | Freq: Every day | CUTANEOUS | Status: DC

## 2019-05-28 NOTE — Progress Notes (Signed)
PATIENT ID: Sydney Leach  MRN: [id number]  DOB/AGE:  May 28, 1956 / 63 y.o.  1 Day Post-Op Procedure(s) (LRB): TOTAL HIP ARTHROPLASTY ANTERIOR APPROACH (Left)    PROGRESS NOTE Subjective: Patient is alert, oriented, no Nausea, no Vomiting, yes passing gas, . Taking PO well. Denies SOB, Chest or Calf Pain. Using Incentive Spirometer, PAS in place. Ambulate WBAT with pt working on transfers. Patient reports pain as  10/10 .    Objective: Vital signs in last 24 hours: Vitals:   05/27/19 2050 05/27/19 2120 05/28/19 0024 05/28/19 0701  BP: 116/64  134/75 128/66  Pulse: 76 75 91 84  Resp: 16 15 16 16   Temp: 98.2 F (36.8 C)  98.2 F (36.8 C) 98.3 F (36.8 C)  TempSrc: Oral  Oral Oral  SpO2: 93% 92% 96% 98%  Weight:      Height:          Intake/Output from previous day: I/O last 3 completed shifts: In: 4241.4 [P.O.:480; I.V.:3111.4; IV Piggyback:650] Out: [id number]; Blood:250]   Intake/Output this shift: No intake/output data recorded.   LABORATORY DATA: Recent Labs    05/27/19 1656 05/27/19 2059 05/28/19 0712 05/28/19 0747  WBC  --   --   --  9.2  HGB  --   --   --  9.9*  HCT  --   --   --  29.6*  PLT  --   --   --  192  GLUCAP 174* 174* 115*  --     Examination: Neurologically intact Neurovascular intact Sensation intact distally Intact pulses distally Dorsiflexion/Plantar flexion intact Incision: dressing C/D/I and no drainage No cellulitis present Compartment soft} XR AP&Lat of hip shows well placed\fixed THA  Assessment:   1 Day Post-Op Procedure(s) (LRB): TOTAL HIP ARTHROPLASTY ANTERIOR APPROACH (Left) ADDITIONAL DIAGNOSIS:  Expected Acute Blood Loss Anemia, Hypertension  Patient's anticipated LOS is less than 2 midnights, meeting these requirements: - Younger than 95 - Lives within 1 hour of care - Has a competent adult at home to recover with post-op recover - NO history of  - Chronic pain requiring opiods  - Diabetes  - Coronary  Artery Disease  - Heart failure  - Heart attack  - Stroke  - DVT/VTE  - Cardiac arrhythmia  - Respiratory Failure/COPD  - Renal failure  - Anemia  - Advanced Liver disease       Plan: PT/OT WBAT, THA  DVT Prophylaxis: SCDx72 hrs, ASA 325 mg BID x 2 weeks  DISCHARGE PLAN: Home, when pt passes therapy goals  DISCHARGE NEEDS: HHPT, Walker and 3-in-1 comode seat
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
## 2019-05-28 NOTE — TOC Initial Note (Signed)
Transition of Care [facility]) - Initial/Assessment Note    Patient Details  Name: Sydney Leach MRN: [id number] Date of Birth: [birth year]  Transition of Care [facility] CM/SW Contact:    Joaquin Courts, RN Phone Number: 05/28/2019, 10:53 AM  Clinical Narrative:             CM spoke with patient at the bedside. Patient set up with [facility] at home for [place]. Patient report has rolling walker and 3-in-1.        Expected Discharge Plan: [place] Barriers to Discharge: No Barriers Identified   Patient Goals and CMS Choice Patient states their goals for this hospitalization and ongoing recovery are:: to go home with therapy CMS Medicare.gov Compare Post Acute Care list provided to:: Patient Choice offered to / list presented to : Patient  Expected Discharge Plan and Services Expected Discharge Plan: [place]   Discharge Planning Services: CM Consult Post Acute Care Choice: [place] arrangements for the past 2 months: Single Family Home Expected Discharge Date: 05/28/19               DME Arranged: N/A DME Agency: NA       HH Arranged: PT [place] Agency: [facility] [employer] [facility] [employer]     Representative spoke with at [place]: pre arranged in MD office  Prior Living Arrangements/Services Living arrangements for the past 2 months: [place] with:: Spouse Patient language and need for interpreter reviewed:: Yes Do you feel safe going back to the place where you live?: Yes      Need for Family Participation in Patient Care: Yes (Comment) Care giver support system in place?: Yes (comment)   Criminal Activity/Legal Involvement Pertinent to Current Situation/Hospitalization: No - Comment as needed  Activities of Daily Living Home Assistive Devices/Equipment: Contact lenses, CBG Meter, CPAP, Cane (specify quad or straight), Electric scooter ADL Screening (condition at time of admission) Patient's  cognitive ability adequate to safely complete daily activities?: Yes Is the patient deaf or have difficulty hearing?: No Does the patient have difficulty seeing, even when wearing glasses/contacts?: No Does the patient have difficulty concentrating, remembering, or making decisions?: No Patient able to express need for assistance with ADLs?: Yes Does the patient have difficulty dressing or bathing?: No Independently performs ADLs?: Yes (appropriate for developmental age) Does the patient have difficulty walking or climbing stairs?: Yes Weakness of Legs: None Weakness of Arms/Hands: None  Permission Sought/Granted   Permission granted to share information with : Yes, Verbal Permission Granted     Permission granted to share info w AGENCY: [facility]        Emotional Assessment Appearance:: Appears stated age Attitude/Demeanor/Rapport: Engaged Affect (typically observed): Accepting Orientation: : Oriented to Self, Oriented to Place, Oriented to  Time, Oriented to Situation   Psych Involvement: No (comment)  Admission diagnosis:  LEFT HIP OSTEOARTHRITIS Patient Active Problem List   Diagnosis Date Noted  . Primary osteoarthritis of left hip 05/27/2019  . Pre-operative cardiovascular examination 05/04/2019  . Essential hypertension 03/07/2019  . Type 2 diabetes mellitus without complication, without long-term current use of insulin ([place]) 03/07/2019  . Class 3 severe obesity with serious comorbidity and body mass index (BMI) of 40.0 to 44.9 in adult [facility]) 03/07/2019   PCP:  Nicoletta Dress, MD Pharmacy:   [facility] [address] [place] [address] Phone: [phone] Fax: [phone]  Social Determinants of Health (SDOH) Interventions    Readmission Risk Interventions No flowsheet data found.

## 2019-05-28 NOTE — Progress Notes (Signed)
Physical Therapy Treatment Patient Details Name: Sydney Leach MRN: [id number] DOB: 1955/07/28 Today's Date: 05/28/2019    History of Present Illness Patient is 63 y.o. female s/p Lt THA anterior approach with PMH significant for DM, HTN, obesity, OA, GERD, fibrmyalgia, and brights disease.    PT Comments    Progressing slowly with mobility. Pt requires encouragement for progression of activity. She is easily distracted by pain-pt seems to have a poor pain tolerance. Do not anticipate pt meeting her PT goals on today. Will continue to follow and progress activity as able.     Follow Up Recommendations  Follow surgeon's recommendation for DC plan and follow-up therapies;Supervision/Assistance - 24 hour     Equipment Recommendations  None recommended by PT    Recommendations for Other Services       Precautions / Restrictions Precautions Precautions: Fall Restrictions Weight Bearing Restrictions: No Other Position/Activity Restrictions: WBAT    Mobility  Bed Mobility Overal bed mobility: Needs Assistance Bed Mobility: Supine to Sit     Supine to sit: Mod assist;+2 for physical assistance;+2 for safety/equipment;HOB elevated     General bed mobility comments: Mod VCs for safety, technique. Pt requires encourgement for progression of activity. Assist for trunk and LEs. Increased time.  Transfers Overall transfer level: Needs assistance Equipment used: Rolling walker (2 wheeled) Transfers: Sit to/from Stand Sit to Stand: Min assist;+2 physical assistance;+2 safety/equipment;From elevated surface         General transfer comment: Assist to rise, stabilize, control descent. VCs safety, technique, hand/LE placement.  Ambulation/Gait Ambulation/Gait assistance: Min assist;+2 safety/equipment Gait Distance (Feet): 25 Feet Assistive device: Rolling walker (2 wheeled) Gait Pattern/deviations: Step-to pattern;Antalgic     General Gait Details: VCs safety, technique,  sequence, proper advancement of L LE (stepping instead of sliding foot). Very slow gait speed. Followed with recliner.   Stairs             Wheelchair Mobility    Modified Rankin (Stroke Patients Only)       Balance Overall balance assessment: Needs assistance         Standing balance support: Bilateral upper extremity supported Standing balance-Leahy Scale: Poor                              Cognition Arousal/Alertness: Awake/alert Behavior During Therapy: Anxious Overall Cognitive Status: Within Functional Limits for tasks assessed                                 General Comments: Poor pain tolerance. Anxiety due to anticipation of pain.      Exercises      General Comments        Pertinent Vitals/Pain Pain Assessment: Faces Faces Pain Scale: Hurts even more Pain Location: L hip/thigh Pain Descriptors / Indicators: Moaning;Grimacing;Guarding(yelling out) Pain Intervention(s): Monitored during session;Limited activity within patient's tolerance;Repositioned;Ice applied    Home Living                      Prior Function            PT Goals (current goals can now be found in the care plan section) Progress towards PT goals: Progressing toward goals    Frequency    7X/week      PT Plan Current plan remains appropriate    Co-evaluation  AM-PAC PT "6 Clicks" Mobility   Outcome Measure  Help needed turning from your back to your side while in a flat bed without using bedrails?: A Lot Help needed moving from lying on your back to sitting on the side of a flat bed without using bedrails?: A Lot Help needed moving to and from a bed to a chair (including a wheelchair)?: A Little Help needed standing up from a chair using your arms (e.g., wheelchair or bedside chair)?: A Little Help needed to walk in hospital room?: A Little Help needed climbing 3-5 steps with a railing? : A Lot 6 Click Score:  15    End of Session Equipment Utilized During Treatment: Gait belt Activity Tolerance: Patient limited by pain Patient left: in chair   PT Visit Diagnosis: Pain;Other abnormalities of gait and mobility (R26.89) Pain - Right/Left: Left Pain - part of body: Hip     Time: 1000-1025 PT Time Calculation (min) (ACUTE ONLY): 25 min  Charges:  $Gait Training: 23-37 mins                       Weston Anna, PT Acute Rehabilitation Services Pager: [phone] Office: [phone]

## 2019-05-28 NOTE — Progress Notes (Signed)
Pt refused to have foley cath removed at this time

## 2019-05-28 NOTE — Progress Notes (Signed)
Pt refused to allow nurse to remove foley this a.m.  Nurse spoke with Joanell Rising, PA and he stated that foley is allowed to stay until pt is able to move and get OOB to bathroom.

## 2019-05-28 NOTE — Progress Notes (Signed)
Physical Therapy Treatment Patient Details Name: Sydney Leach MRN: [id number] DOB: 04/29/1956 Today's Date: 05/28/2019    History of Present Illness Patient is 63 y.o. female s/p Lt THA anterior approach with PMH significant for DM, HTN, obesity, OA, GERD, fibrmyalgia, and brights disease.    PT Comments    Progressing slowly with mobility. Pt continues to require encouragement for progression of activity and for performing task as independently as possible. She remains anxious 2* anticipation of pain. Will continue to progress activity as able.     Follow Up Recommendations  Follow surgeon's recommendation for DC plan and follow-up therapies;Supervision/Assistance - 24 hour     Equipment Recommendations  None recommended by PT    Recommendations for Other Services       Precautions / Restrictions Precautions Precautions: Fall Restrictions Weight Bearing Restrictions: No Other Position/Activity Restrictions: WBAT    Mobility  Bed Mobility Overal bed mobility: Needs Assistance Bed Mobility: Sit to Supine     Supine to sit: Mod assist;+2 for physical assistance;+2 for safety/equipment;HOB elevated Sit to supine: Mod assist;+2 for physical assistance;+2 for safety/equipment;HOB elevated   General bed mobility comments: Mod VCs for safety, technique. Assist for trunk and LEs. Increased time. Pt tends to stall 2* anticipation of pain  Transfers Overall transfer level: Needs assistance Equipment used: Rolling walker (2 wheeled) Transfers: Sit to/from Stand Sit to Stand: Min assist;+2 physical assistance;+2 safety/equipment;From elevated surface         General transfer comment: Assist to rise, stabilize, control descent. VCs safety, technique, hand/LE placement.  Ambulation/Gait Ambulation/Gait assistance: Min assist;+2 safety/equipment Gait Distance (Feet): 25 Feet Assistive device: Rolling walker (2 wheeled) Gait Pattern/deviations: Step-to pattern;Antalgic     General Gait Details: VCs safety, technique, sequence, proper advancement of L LE. Pt is advancing L LE better. Slow gait speed. Followed with recliner.   Stairs             Wheelchair Mobility    Modified Rankin (Stroke Patients Only)       Balance Overall balance assessment: Needs assistance         Standing balance support: Bilateral upper extremity supported Standing balance-Leahy Scale: Poor                              Cognition Arousal/Alertness: Awake/alert Behavior During Therapy: Anxious Overall Cognitive Status: Within Functional Limits for tasks assessed                                 General Comments: Poor pain tolerance. Anxiety due to anticipation of pain.      Exercises      General Comments        Pertinent Vitals/Pain Pain Assessment: Faces Faces Pain Scale: Hurts even more Pain Location: L hip/thigh Pain Descriptors / Indicators: Moaning;Grimacing;Guarding(yelling out) Pain Intervention(s): Monitored during session;Repositioned    Home Living                      Prior Function            PT Goals (current goals can now be found in the care plan section) Progress towards PT goals: Progressing toward goals    Frequency    7X/week      PT Plan Current plan remains appropriate    Co-evaluation  AM-PAC PT "6 Clicks" Mobility   Outcome Measure  Help needed turning from your back to your side while in a flat bed without using bedrails?: A Lot Help needed moving from lying on your back to sitting on the side of a flat bed without using bedrails?: A Lot Help needed moving to and from a bed to a chair (including a wheelchair)?: A Little Help needed standing up from a chair using your arms (e.g., wheelchair or bedside chair)?: A Little Help needed to walk in hospital room?: A Little Help needed climbing 3-5 steps with a railing? : A Lot 6 Click Score: 15    End of  Session Equipment Utilized During Treatment: Gait belt Activity Tolerance: Patient limited by pain Patient left: in bed;with call bell/phone within reach;with family/visitor present;with bed alarm set   PT Visit Diagnosis: Pain;Other abnormalities of gait and mobility (R26.89) Pain - Right/Left: Left Pain - part of body: Hip     Time: [id number] PT Time Calculation (min) (ACUTE ONLY): 23 min  Charges:  $Gait Training: 23-37 mins                        Weston Anna, PT Acute Rehabilitation Services Pager: [phone] Office: [phone]

## 2019-05-29 DIAGNOSIS — M1612 Unilateral primary osteoarthritis, left hip: Secondary | ICD-10-CM | POA: Diagnosis not present

## 2019-05-29 DIAGNOSIS — Z96649 Presence of unspecified artificial hip joint: Secondary | ICD-10-CM

## 2019-05-29 LAB — CBC
HCT: 28.9 % — ABNORMAL LOW (ref 36.0–46.0)
Hemoglobin: 9.3 g/dL — ABNORMAL LOW (ref 12.0–15.0)
MCH: 30 pg (ref 26.0–34.0)
MCHC: 32.2 g/dL (ref 30.0–36.0)
MCV: 93.2 fL (ref 80.0–100.0)
Platelets: 187 10*3/uL (ref 150–400)
RBC: 3.1 MIL/uL — ABNORMAL LOW (ref 3.87–5.11)
RDW: 12.3 % (ref 11.5–15.5)
WBC: 12.4 10*3/uL — ABNORMAL HIGH (ref 4.0–10.5)
nRBC: 0 % (ref 0.0–0.2)

## 2019-05-29 LAB — GLUCOSE, CAPILLARY
Glucose-Capillary: 143 mg/dL — ABNORMAL HIGH (ref 70–99)
Glucose-Capillary: 116 mg/dL — ABNORMAL HIGH (ref 70–99)

## 2019-05-29 NOTE — Plan of Care (Signed)
  Problem: Education: Goal: Knowledge of General Education information will improve Description: Including pain rating scale, medication(s)/side effects and non-pharmacologic comfort measures 05/29/2019 1522 by Hubert Azure, RN Outcome: Adequate for Discharge 05/29/2019 1111 by Hubert Azure, RN Outcome: Progressing   Problem: Health Behavior/Discharge Planning: Goal: Ability to manage health-related needs will improve 05/29/2019 1522 by Hubert Azure, RN Outcome: Adequate for Discharge 05/29/2019 1111 by Hubert Azure, RN Outcome: Progressing   Problem: Clinical Measurements: Goal: Ability to maintain clinical measurements within normal limits will improve 05/29/2019 1522 by Hubert Azure, RN Outcome: Adequate for Discharge 05/29/2019 1111 by Hubert Azure, RN Outcome: Progressing Goal: Will remain free from infection 05/29/2019 1522 by Hubert Azure, RN Outcome: Adequate for Discharge 05/29/2019 1111 by Hubert Azure, RN Outcome: Progressing Goal: Diagnostic test results will improve 05/29/2019 1522 by Hubert Azure, RN Outcome: Adequate for Discharge 05/29/2019 1111 by Hubert Azure, RN Outcome: Progressing Goal: Respiratory complications will improve 05/29/2019 1522 by Hubert Azure, RN Outcome: Adequate for Discharge 05/29/2019 1111 by Hubert Azure, RN Outcome: Progressing Goal: Cardiovascular complication will be avoided 05/29/2019 1522 by Hubert Azure, RN Outcome: Adequate for Discharge 05/29/2019 1111 by Hubert Azure, RN Outcome: Progressing   Problem: Activity: Goal: Risk for activity intolerance will decrease Outcome: Adequate for Discharge   Problem: Nutrition: Goal: Adequate nutrition will be maintained Outcome: Adequate for Discharge   Problem: Coping: Goal: Level of anxiety will decrease Outcome: Adequate for Discharge   Problem: Elimination: Goal: Will not experience complications related to bowel motility Outcome: Adequate for  Discharge Goal: Will not experience complications related to urinary retention Outcome: Adequate for Discharge   Problem: Pain Managment: Goal: General experience of comfort will improve Outcome: Adequate for Discharge   Problem: Safety: Goal: Ability to remain free from injury will improve Outcome: Adequate for Discharge   Problem: Skin Integrity: Goal: Risk for impaired skin integrity will decrease Outcome: Adequate for Discharge   Problem: Education: Goal: Knowledge of the prescribed therapeutic regimen will improve Outcome: Adequate for Discharge Goal: Understanding of discharge needs will improve Outcome: Adequate for Discharge Goal: Individualized Educational Video(s) Outcome: Adequate for Discharge   Problem: Activity: Goal: Ability to avoid complications of mobility impairment will improve Outcome: Adequate for Discharge Goal: Ability to tolerate increased activity will improve Outcome: Adequate for Discharge   Problem: Clinical Measurements: Goal: Postoperative complications will be avoided or minimized Outcome: Adequate for Discharge   Problem: Pain Management: Goal: Pain level will decrease with appropriate interventions Outcome: Adequate for Discharge   Problem: Skin Integrity: Goal: Will show signs of wound healing Outcome: Adequate for Discharge

## 2019-05-29 NOTE — Progress Notes (Signed)
PATIENT ID: Sydney Leach  MRN: [id number]  DOB/AGE:  65/27/1957 / 63 y.o.  2 Days Post-Op Procedure(s) (LRB): TOTAL HIP ARTHROPLASTY ANTERIOR APPROACH (Left)    PROGRESS NOTE Subjective: Patient is alert, oriented, no Nausea, no Vomiting, yes passing gas, . Taking PO well. Denies SOB, Chest or Calf Pain. Using Incentive Spirometer, PAS in place. Ambulate WBAT with pt walking 25 ft with therapy Patient reports pain as  moderate .    Objective: Vital signs in last 24 hours: Vitals:   05/28/19 0938 05/28/19 1415 05/28/19 2038 05/29/19 0539  BP: 121/60 (!) 108/55 (!) 107/47 119/63  Pulse: 93 78 81 69  Resp: 16 16 16 14   Temp: 98.6 F (37 C) 98 F (36.7 C) 98.5 F (36.9 C) 97.7 F (36.5 C)  TempSrc: Oral Oral Oral Oral  SpO2: 100% 97% 95% 94%  Weight:      Height:          Intake/Output from previous day: I/O last 3 completed shifts: In: 2668.7 [P.O.:480; I.V.:1988.7; IV Piggyback:200] Out: 4500 [Urine:4500]   Intake/Output this shift: No intake/output data recorded.   LABORATORY DATA: Recent Labs    05/28/19 0747  05/28/19 1725 05/28/19 2040 05/29/19 0302 05/29/19 0723  WBC 9.2  --   --   --  12.4*  --   HGB 9.9*  --   --   --  9.3*  --   HCT 29.6*  --   --   --  28.9*  --   PLT 192  --   --   --  187  --   GLUCAP  --    < > 175* 165*  --  143*   < > = values in this interval not displayed.    Examination: Neurologically intact Neurovascular intact Sensation intact distally Intact pulses distally Dorsiflexion/Plantar flexion intact Incision: dressing C/D/I and no drainage No cellulitis present Compartment soft} XR AP&Lat of hip shows well placed\fixed THA  Assessment:   2 Days Post-Op Procedure(s) (LRB): TOTAL HIP ARTHROPLASTY ANTERIOR APPROACH (Left) ADDITIONAL DIAGNOSIS:  Expected Acute Blood Loss Anemia, Hypertension  Patient's anticipated LOS is less than 2 midnights, meeting these requirements: - Younger than 55 - Lives within 1 hour of  care - Has a competent adult at home to recover with post-op recover - NO history of  - Chronic pain requiring opiods  - Diabetes  - Coronary Artery Disease  - Heart failure  - Heart attack  - Stroke  - DVT/VTE  - Cardiac arrhythmia  - Respiratory Failure/COPD  - Renal failure  - Anemia  - Advanced Liver disease       Plan: PT/OT WBAT, THA  DVT Prophylaxis: SCDx72 hrs, ASA 325 mg BID x 2 weeks  DISCHARGE PLAN: Home  DISCHARGE NEEDS: HHPT, Walker and 3-in-1 comode seat

## 2019-05-29 NOTE — Plan of Care (Signed)

## 2019-05-29 NOTE — Progress Notes (Signed)
Physical Therapy Treatment Patient Details Name: Sydney Leach MRN: [id number] DOB: [birth year] Today's Date: 05/29/2019    History of Present Illness Patient is 63 y.o. female s/p Lt THA anterior approach with PMH significant for DM, HTN, obesity, OA, GERD, fibrmyalgia, and brights disease.    PT Comments    Improved participation and performance on today. Will continue to progress activity. If pt does well with stair negotiation this afternoon, she should be fine to d/c home later today.    Follow Up Recommendations  Follow surgeon's recommendation for DC plan and follow-up therapies;Supervision/Assistance - 24 hour     Equipment Recommendations  None recommended by PT    Recommendations for Other Services       Precautions / Restrictions Restrictions Weight Bearing Restrictions: No Other Position/Activity Restrictions: WBAT    Mobility  Bed Mobility Overal bed mobility: Needs Assistance Bed Mobility: Supine to Sit     Supine to sit: Min guard;HOB elevated;Min assist     General bed mobility comments: small amount of assist for L LE. Otherwise, pt was Min guard with reliance on bedrail. Increased time and cues for technique.  Transfers Overall transfer level: Needs assistance Equipment used: Rolling walker (2 wheeled) Transfers: Sit to/from Stand Sit to Stand: Min guard         General transfer comment: Close guard for safety. Vcs safety hand/LE placement.  Ambulation/Gait Ambulation/Gait assistance: Min guard Gait Distance (Feet): 100 Feet Assistive device: Rolling walker (2 wheeled) Gait Pattern/deviations: Step-to pattern;Step-through pattern;Decreased stride length     General Gait Details: VCs safety, technique, sequence, proper advancement of L LE. Slow gait speed. Pt tolerated distance well.   Stairs             Wheelchair Mobility    Modified Rankin (Stroke Patients Only)       Balance Overall balance assessment: Needs  assistance         Standing balance support: Bilateral upper extremity supported Standing balance-Leahy Scale: Poor                              Cognition Arousal/Alertness: Awake/alert Behavior During Therapy: WFL for tasks assessed/performed Overall Cognitive Status: Within Functional Limits for tasks assessed                                 General Comments: Poor pain tolerance. Anxiety due to anticipation of pain.      Exercises Total Joint Exercises Ankle Circles/Pumps: AROM;10 reps;Supine;Both Quad Sets: AROM;Both;10 reps;Supine Heel Slides: AAROM;Left;10 reps;Supine Hip ABduction/ADduction: AAROM;Left;10 reps;Supine    General Comments        Pertinent Vitals/Pain Pain Assessment: 0-10 Faces Pain Scale: Hurts even more Pain Location: L hip/thigh Pain Descriptors / Indicators: Discomfort;Sore;Grimacing;Guarding Pain Intervention(s): Monitored during session;Repositioned;Ice applied    Home Living                      Prior Function            PT Goals (current goals can now be found in the care plan section) Progress towards PT goals: Progressing toward goals    Frequency    7X/week      PT Plan Current plan remains appropriate    Co-evaluation              AM-PAC PT "6 Clicks" Mobility   Outcome Measure  Help needed turning from your back to your side while in a flat bed without using bedrails?: A Little Help needed moving from lying on your back to sitting on the side of a flat bed without using bedrails?: A Little Help needed moving to and from a bed to a chair (including a wheelchair)?: A Little Help needed standing up from a chair using your arms (e.g., wheelchair or bedside chair)?: A Little Help needed to walk in hospital room?: A Little Help needed climbing 3-5 steps with a railing? : A Little 6 Click Score: 18    End of Session Equipment Utilized During Treatment: Gait belt Activity  Tolerance: Patient tolerated treatment well Patient left: in chair;with call bell/phone within reach   PT Visit Diagnosis: Pain;Other abnormalities of gait and mobility (R26.89) Pain - Right/Left: Left Pain - part of body: Hip     Time: 0911-0949 PT Time Calculation (min) (ACUTE ONLY): 38 min  Charges:  $Gait Training: 23-37 mins $Therapeutic Exercise: 8-22 mins                       Weston Anna, PT Acute Rehabilitation Services Pager: [phone] Office: [phone]

## 2019-05-29 NOTE — Progress Notes (Signed)
Physical Therapy Treatment Patient Details Name: Sydney Leach MRN: [id number] DOB: 07/16/1955 Today's Date: 05/29/2019    History of Present Illness Patient is 63 y.o. female s/p Lt THA anterior approach with PMH significant for DM, HTN, obesity, OA, GERD, fibrmyalgia, and brights disease.    PT Comments    Progressing well with mobility. Reviewed/practiced gait training and stair training. All education completed. No further questions from pt or husband. Okay to d/c from PT standpoint.    Follow Up Recommendations  Follow surgeon's recommendation for DC plan and follow-up therapies;Supervision/Assistance - 24 hour     Equipment Recommendations  None recommended by PT    Recommendations for Other Services       Precautions / Restrictions Precautions Precautions: Fall Restrictions Weight Bearing Restrictions: No Other Position/Activity Restrictions: WBAT    Mobility  Bed Mobility Overal bed mobility: Needs Assistance Bed Mobility: Supine to Sit          General bed mobility comments: oob in recliner  Transfers Overall transfer level: Needs assistance Equipment used: Rolling walker (2 wheeled) Transfers: Sit to/from Stand Sit to Stand: Min guard         General transfer comment: Close guard for safety. Vcs safety hand/LE placement.  Ambulation/Gait Ambulation/Gait assistance: Min guard Gait Distance (Feet): 50 Feet Assistive device: Rolling walker (2 wheeled) Gait Pattern/deviations: Step-to pattern;Step-through pattern;Decreased stride length     General Gait Details: VCs safety, technique, sequence, proper advancement of L LE. Slow gait speed. Pt tolerated distance well.   Stairs Stairs: Yes Min guard    Number of Stairs: 3 General stair comments: VCs safety, technique, sequence. Close guard for safety. Husband present to observe.   Wheelchair Mobility    Modified Rankin (Stroke Patients Only)       Balance Overall balance assessment:  Needs assistance         Standing balance support: Bilateral upper extremity supported Standing balance-Leahy Scale: Poor                              Cognition Arousal/Alertness: Awake/alert Behavior During Therapy: WFL for tasks assessed/performed Overall Cognitive Status: Within Functional Limits for tasks assessed                                 General Comments: Poor pain tolerance. Anxiety due to anticipation of pain.      Exercises Total Joint Exercises Ankle Circles/Pumps: AROM;10 reps;Supine;Both Quad Sets: AROM;Both;10 reps;Supine Heel Slides: AAROM;Left;10 reps;Supine Hip ABduction/ADduction: AAROM;Left;10 reps;Supine    General Comments        Pertinent Vitals/Pain Pain Assessment: 0-10 Pain Score: 6  Faces Pain Scale: Hurts even more Pain Location: L hip/thigh Pain Descriptors / Indicators: Discomfort;Sore;Grimacing;Guarding Pain Intervention(s): Monitored during session    Home Living                      Prior Function            PT Goals (current goals can now be found in the care plan section) Progress towards PT goals: Progressing toward goals    Frequency    7X/week      PT Plan Current plan remains appropriate    Co-evaluation              AM-PAC PT "6 Clicks" Mobility   Outcome Measure  Help needed turning from your back  to your side while in a flat bed without using bedrails?: A Little Help needed moving from lying on your back to sitting on the side of a flat bed without using bedrails?: A Little Help needed moving to and from a bed to a chair (including a wheelchair)?: A Little Help needed standing up from a chair using your arms (e.g., wheelchair or bedside chair)?: A Little Help needed to walk in hospital room?: A Little Help needed climbing 3-5 steps with a railing? : A Little 6 Click Score: 18    End of Session Equipment Utilized During Treatment: Gait belt Activity Tolerance:  Patient tolerated treatment well Patient left: in bed;with call bell/phone within reach;with family/visitor present   PT Visit Diagnosis: Pain;Other abnormalities of gait and mobility (R26.89) Pain - Right/Left: Left Pain - part of body: Hip     Time: 1410-1440 PT Time Calculation (min) (ACUTE ONLY): 30 min  Charges:  $Gait Training: 23-37 mins $Therapeutic Exercise: 8-22 mins                        Weston Anna, PT Acute Rehabilitation Services Pager: [phone] Office: [phone]

## 2019-05-30 ENCOUNTER — Encounter (HOSPITAL_COMMUNITY): Payer: Self-pay | Admitting: Orthopedic Surgery

## 2019-05-31 ENCOUNTER — Ambulatory Visit (INDEPENDENT_AMBULATORY_CARE_PROVIDER_SITE_OTHER): Payer: PPO | Admitting: Family Medicine

## 2019-05-31 DIAGNOSIS — M797 Fibromyalgia: Secondary | ICD-10-CM | POA: Diagnosis not present

## 2019-05-31 DIAGNOSIS — I10 Essential (primary) hypertension: Secondary | ICD-10-CM | POA: Diagnosis not present

## 2019-05-31 DIAGNOSIS — Z6839 Body mass index (BMI) 39.0-39.9, adult: Secondary | ICD-10-CM | POA: Diagnosis not present

## 2019-05-31 DIAGNOSIS — Z85828 Personal history of other malignant neoplasm of skin: Secondary | ICD-10-CM | POA: Diagnosis not present

## 2019-05-31 DIAGNOSIS — E119 Type 2 diabetes mellitus without complications: Secondary | ICD-10-CM | POA: Diagnosis not present

## 2019-05-31 DIAGNOSIS — Z9181 History of falling: Secondary | ICD-10-CM | POA: Diagnosis not present

## 2019-05-31 DIAGNOSIS — Z86010 Personal history of colonic polyps: Secondary | ICD-10-CM | POA: Diagnosis not present

## 2019-05-31 DIAGNOSIS — G4730 Sleep apnea, unspecified: Secondary | ICD-10-CM | POA: Diagnosis not present

## 2019-05-31 DIAGNOSIS — E559 Vitamin D deficiency, unspecified: Secondary | ICD-10-CM | POA: Diagnosis not present

## 2019-05-31 DIAGNOSIS — Z96642 Presence of left artificial hip joint: Secondary | ICD-10-CM | POA: Diagnosis not present

## 2019-05-31 DIAGNOSIS — Z471 Aftercare following joint replacement surgery: Secondary | ICD-10-CM | POA: Diagnosis not present

## 2019-05-31 DIAGNOSIS — K219 Gastro-esophageal reflux disease without esophagitis: Secondary | ICD-10-CM | POA: Diagnosis not present

## 2019-06-09 DIAGNOSIS — U071 COVID-19: Secondary | ICD-10-CM | POA: Diagnosis not present

## 2019-06-09 DIAGNOSIS — Z20828 Contact with and (suspected) exposure to other viral communicable diseases: Secondary | ICD-10-CM | POA: Diagnosis not present

## 2019-06-09 DIAGNOSIS — R6889 Other general symptoms and signs: Secondary | ICD-10-CM | POA: Diagnosis not present

## 2019-06-09 DIAGNOSIS — N390 Urinary tract infection, site not specified: Secondary | ICD-10-CM | POA: Diagnosis not present

## 2019-06-13 DIAGNOSIS — M1612 Unilateral primary osteoarthritis, left hip: Secondary | ICD-10-CM | POA: Diagnosis not present

## 2019-06-20 DIAGNOSIS — M6281 Muscle weakness (generalized): Secondary | ICD-10-CM | POA: Diagnosis not present

## 2019-06-20 DIAGNOSIS — M25552 Pain in left hip: Secondary | ICD-10-CM | POA: Diagnosis not present

## 2019-06-20 DIAGNOSIS — M1612 Unilateral primary osteoarthritis, left hip: Secondary | ICD-10-CM | POA: Diagnosis not present

## 2019-06-20 DIAGNOSIS — Z96642 Presence of left artificial hip joint: Secondary | ICD-10-CM | POA: Diagnosis not present

## 2019-06-23 DIAGNOSIS — M1612 Unilateral primary osteoarthritis, left hip: Secondary | ICD-10-CM | POA: Diagnosis not present

## 2019-06-23 DIAGNOSIS — M6281 Muscle weakness (generalized): Secondary | ICD-10-CM | POA: Diagnosis not present

## 2019-06-23 DIAGNOSIS — Z96642 Presence of left artificial hip joint: Secondary | ICD-10-CM | POA: Diagnosis not present

## 2019-06-23 DIAGNOSIS — M25552 Pain in left hip: Secondary | ICD-10-CM | POA: Diagnosis not present

## 2019-06-28 DIAGNOSIS — Z96642 Presence of left artificial hip joint: Secondary | ICD-10-CM | POA: Diagnosis not present

## 2019-06-28 DIAGNOSIS — M6281 Muscle weakness (generalized): Secondary | ICD-10-CM | POA: Diagnosis not present

## 2019-06-28 DIAGNOSIS — M25552 Pain in left hip: Secondary | ICD-10-CM | POA: Diagnosis not present

## 2019-06-28 DIAGNOSIS — M1612 Unilateral primary osteoarthritis, left hip: Secondary | ICD-10-CM | POA: Diagnosis not present

## 2019-06-30 DIAGNOSIS — M6281 Muscle weakness (generalized): Secondary | ICD-10-CM | POA: Diagnosis not present

## 2019-06-30 DIAGNOSIS — Z96642 Presence of left artificial hip joint: Secondary | ICD-10-CM | POA: Diagnosis not present

## 2019-06-30 DIAGNOSIS — M25552 Pain in left hip: Secondary | ICD-10-CM | POA: Diagnosis not present

## 2019-06-30 DIAGNOSIS — M1612 Unilateral primary osteoarthritis, left hip: Secondary | ICD-10-CM | POA: Diagnosis not present

## 2019-07-04 DIAGNOSIS — G4733 Obstructive sleep apnea (adult) (pediatric): Secondary | ICD-10-CM | POA: Diagnosis not present

## 2019-07-05 ENCOUNTER — Ambulatory Visit: Payer: BC Managed Care – PPO | Admitting: Cardiology

## 2019-07-05 DIAGNOSIS — M25552 Pain in left hip: Secondary | ICD-10-CM | POA: Diagnosis not present

## 2019-07-05 DIAGNOSIS — M6281 Muscle weakness (generalized): Secondary | ICD-10-CM | POA: Diagnosis not present

## 2019-07-05 DIAGNOSIS — Z471 Aftercare following joint replacement surgery: Secondary | ICD-10-CM | POA: Diagnosis not present

## 2019-07-05 DIAGNOSIS — M1612 Unilateral primary osteoarthritis, left hip: Secondary | ICD-10-CM | POA: Diagnosis not present

## 2019-07-05 DIAGNOSIS — Z96642 Presence of left artificial hip joint: Secondary | ICD-10-CM | POA: Diagnosis not present

## 2019-07-07 DIAGNOSIS — Z96642 Presence of left artificial hip joint: Secondary | ICD-10-CM | POA: Diagnosis not present

## 2019-07-07 DIAGNOSIS — M1612 Unilateral primary osteoarthritis, left hip: Secondary | ICD-10-CM | POA: Diagnosis not present

## 2019-07-07 DIAGNOSIS — M6281 Muscle weakness (generalized): Secondary | ICD-10-CM | POA: Diagnosis not present

## 2019-07-07 DIAGNOSIS — M25552 Pain in left hip: Secondary | ICD-10-CM | POA: Diagnosis not present

## 2019-07-12 DIAGNOSIS — M6281 Muscle weakness (generalized): Secondary | ICD-10-CM | POA: Diagnosis not present

## 2019-07-12 DIAGNOSIS — Z96642 Presence of left artificial hip joint: Secondary | ICD-10-CM | POA: Diagnosis not present

## 2019-07-12 DIAGNOSIS — M1612 Unilateral primary osteoarthritis, left hip: Secondary | ICD-10-CM | POA: Diagnosis not present

## 2019-07-12 DIAGNOSIS — M25552 Pain in left hip: Secondary | ICD-10-CM | POA: Diagnosis not present

## 2019-07-14 DIAGNOSIS — Z96642 Presence of left artificial hip joint: Secondary | ICD-10-CM | POA: Diagnosis not present

## 2019-07-14 DIAGNOSIS — M1612 Unilateral primary osteoarthritis, left hip: Secondary | ICD-10-CM | POA: Diagnosis not present

## 2019-07-14 DIAGNOSIS — M6281 Muscle weakness (generalized): Secondary | ICD-10-CM | POA: Diagnosis not present

## 2019-07-14 DIAGNOSIS — M25552 Pain in left hip: Secondary | ICD-10-CM | POA: Diagnosis not present

## 2019-07-19 DIAGNOSIS — M1612 Unilateral primary osteoarthritis, left hip: Secondary | ICD-10-CM | POA: Diagnosis not present

## 2019-07-19 DIAGNOSIS — M6281 Muscle weakness (generalized): Secondary | ICD-10-CM | POA: Diagnosis not present

## 2019-07-19 DIAGNOSIS — Z96642 Presence of left artificial hip joint: Secondary | ICD-10-CM | POA: Diagnosis not present

## 2019-07-19 DIAGNOSIS — M25552 Pain in left hip: Secondary | ICD-10-CM | POA: Diagnosis not present

## 2019-07-22 DIAGNOSIS — M6281 Muscle weakness (generalized): Secondary | ICD-10-CM | POA: Diagnosis not present

## 2019-07-22 DIAGNOSIS — M1612 Unilateral primary osteoarthritis, left hip: Secondary | ICD-10-CM | POA: Diagnosis not present

## 2019-07-22 DIAGNOSIS — Z96642 Presence of left artificial hip joint: Secondary | ICD-10-CM | POA: Diagnosis not present

## 2019-07-22 DIAGNOSIS — M25552 Pain in left hip: Secondary | ICD-10-CM | POA: Diagnosis not present

## 2019-07-26 DIAGNOSIS — M25552 Pain in left hip: Secondary | ICD-10-CM | POA: Diagnosis not present

## 2019-07-26 DIAGNOSIS — Z96642 Presence of left artificial hip joint: Secondary | ICD-10-CM | POA: Diagnosis not present

## 2019-07-26 DIAGNOSIS — M6281 Muscle weakness (generalized): Secondary | ICD-10-CM | POA: Diagnosis not present

## 2019-07-26 DIAGNOSIS — M1612 Unilateral primary osteoarthritis, left hip: Secondary | ICD-10-CM | POA: Diagnosis not present

## 2019-07-28 DIAGNOSIS — M25552 Pain in left hip: Secondary | ICD-10-CM | POA: Diagnosis not present

## 2019-07-28 DIAGNOSIS — M1612 Unilateral primary osteoarthritis, left hip: Secondary | ICD-10-CM | POA: Diagnosis not present

## 2019-07-28 DIAGNOSIS — M6281 Muscle weakness (generalized): Secondary | ICD-10-CM | POA: Diagnosis not present

## 2019-07-28 DIAGNOSIS — Z96642 Presence of left artificial hip joint: Secondary | ICD-10-CM | POA: Diagnosis not present

## 2019-08-02 DIAGNOSIS — M1612 Unilateral primary osteoarthritis, left hip: Secondary | ICD-10-CM | POA: Diagnosis not present

## 2019-08-02 DIAGNOSIS — M25552 Pain in left hip: Secondary | ICD-10-CM | POA: Diagnosis not present

## 2019-08-02 DIAGNOSIS — Z96642 Presence of left artificial hip joint: Secondary | ICD-10-CM | POA: Diagnosis not present

## 2019-08-02 DIAGNOSIS — M6281 Muscle weakness (generalized): Secondary | ICD-10-CM | POA: Diagnosis not present

## 2019-08-04 DIAGNOSIS — Z96642 Presence of left artificial hip joint: Secondary | ICD-10-CM | POA: Diagnosis not present

## 2019-08-04 DIAGNOSIS — Z9889 Other specified postprocedural states: Secondary | ICD-10-CM | POA: Diagnosis not present

## 2019-08-04 DIAGNOSIS — M6281 Muscle weakness (generalized): Secondary | ICD-10-CM | POA: Diagnosis not present

## 2019-08-04 DIAGNOSIS — M25552 Pain in left hip: Secondary | ICD-10-CM | POA: Diagnosis not present

## 2019-08-04 DIAGNOSIS — M1612 Unilateral primary osteoarthritis, left hip: Secondary | ICD-10-CM | POA: Diagnosis not present

## 2019-08-09 DIAGNOSIS — M1612 Unilateral primary osteoarthritis, left hip: Secondary | ICD-10-CM | POA: Diagnosis not present

## 2019-08-09 DIAGNOSIS — Z96642 Presence of left artificial hip joint: Secondary | ICD-10-CM | POA: Diagnosis not present

## 2019-08-09 DIAGNOSIS — M25552 Pain in left hip: Secondary | ICD-10-CM | POA: Diagnosis not present

## 2019-08-09 DIAGNOSIS — M6281 Muscle weakness (generalized): Secondary | ICD-10-CM | POA: Diagnosis not present

## 2019-08-16 DIAGNOSIS — M6281 Muscle weakness (generalized): Secondary | ICD-10-CM | POA: Diagnosis not present

## 2019-08-16 DIAGNOSIS — Z96642 Presence of left artificial hip joint: Secondary | ICD-10-CM | POA: Diagnosis not present

## 2019-08-16 DIAGNOSIS — M25552 Pain in left hip: Secondary | ICD-10-CM | POA: Diagnosis not present

## 2019-08-16 DIAGNOSIS — M1612 Unilateral primary osteoarthritis, left hip: Secondary | ICD-10-CM | POA: Diagnosis not present

## 2019-08-18 DIAGNOSIS — Z96642 Presence of left artificial hip joint: Secondary | ICD-10-CM | POA: Diagnosis not present

## 2019-08-18 DIAGNOSIS — M1612 Unilateral primary osteoarthritis, left hip: Secondary | ICD-10-CM | POA: Diagnosis not present

## 2019-08-18 DIAGNOSIS — M6281 Muscle weakness (generalized): Secondary | ICD-10-CM | POA: Diagnosis not present

## 2019-08-18 DIAGNOSIS — M25552 Pain in left hip: Secondary | ICD-10-CM | POA: Diagnosis not present

## 2019-08-25 ENCOUNTER — Ambulatory Visit: Payer: PPO | Attending: Internal Medicine

## 2019-08-25 DIAGNOSIS — Z23 Encounter for immunization: Secondary | ICD-10-CM | POA: Insufficient documentation

## 2019-08-25 NOTE — Progress Notes (Signed)
   Covid-19 Vaccination Clinic  Name:  Sydney Leach    MRN: [id number] DOB: 07-30-1955  08/25/2019  Sydney Leach was observed post Covid-19 immunization for 15 minutes without incident. She was provided with Vaccine Information Sheet and instruction to access the V-Safe system.   Sydney Leach was instructed to call 911 with any severe reactions post vaccine: Marland Kitchen Difficulty breathing  . Swelling of face and throat  . A fast heartbeat  . A bad rash all over body  . Dizziness and weakness

## 2019-08-26 DIAGNOSIS — E1122 Type 2 diabetes mellitus with diabetic chronic kidney disease: Secondary | ICD-10-CM | POA: Diagnosis not present

## 2019-08-26 DIAGNOSIS — R748 Abnormal levels of other serum enzymes: Secondary | ICD-10-CM | POA: Diagnosis not present

## 2019-08-26 DIAGNOSIS — E785 Hyperlipidemia, unspecified: Secondary | ICD-10-CM | POA: Diagnosis not present

## 2019-08-26 DIAGNOSIS — I1310 Hypertensive heart and chronic kidney disease without heart failure, with stage 1 through stage 4 chronic kidney disease, or unspecified chronic kidney disease: Secondary | ICD-10-CM | POA: Diagnosis not present

## 2019-08-26 DIAGNOSIS — M797 Fibromyalgia: Secondary | ICD-10-CM | POA: Diagnosis not present

## 2019-08-26 DIAGNOSIS — M948X9 Other specified disorders of cartilage, unspecified sites: Secondary | ICD-10-CM | POA: Diagnosis not present

## 2019-08-26 DIAGNOSIS — E559 Vitamin D deficiency, unspecified: Secondary | ICD-10-CM | POA: Diagnosis not present

## 2019-08-26 DIAGNOSIS — M1990 Unspecified osteoarthritis, unspecified site: Secondary | ICD-10-CM | POA: Diagnosis not present

## 2019-08-26 DIAGNOSIS — M898X9 Other specified disorders of bone, unspecified site: Secondary | ICD-10-CM | POA: Diagnosis not present

## 2019-08-26 DIAGNOSIS — N182 Chronic kidney disease, stage 2 (mild): Secondary | ICD-10-CM | POA: Diagnosis not present

## 2019-08-26 DIAGNOSIS — K219 Gastro-esophageal reflux disease without esophagitis: Secondary | ICD-10-CM | POA: Diagnosis not present

## 2019-09-01 DIAGNOSIS — Z96642 Presence of left artificial hip joint: Secondary | ICD-10-CM | POA: Diagnosis not present

## 2019-09-07 DIAGNOSIS — Z20822 Contact with and (suspected) exposure to covid-19: Secondary | ICD-10-CM | POA: Diagnosis not present

## 2019-09-07 DIAGNOSIS — R6889 Other general symptoms and signs: Secondary | ICD-10-CM | POA: Diagnosis not present

## 2019-09-07 DIAGNOSIS — J029 Acute pharyngitis, unspecified: Secondary | ICD-10-CM | POA: Diagnosis not present

## 2019-09-08 DIAGNOSIS — Z20822 Contact with and (suspected) exposure to covid-19: Secondary | ICD-10-CM | POA: Diagnosis not present

## 2019-09-09 DIAGNOSIS — E119 Type 2 diabetes mellitus without complications: Secondary | ICD-10-CM | POA: Diagnosis not present

## 2019-09-09 DIAGNOSIS — U071 COVID-19: Secondary | ICD-10-CM | POA: Diagnosis not present

## 2019-09-09 DIAGNOSIS — R0602 Shortness of breath: Secondary | ICD-10-CM | POA: Diagnosis not present

## 2019-09-09 DIAGNOSIS — J1282 Pneumonia due to coronavirus disease 2019: Secondary | ICD-10-CM | POA: Diagnosis not present

## 2019-09-09 DIAGNOSIS — J020 Streptococcal pharyngitis: Secondary | ICD-10-CM | POA: Diagnosis not present

## 2019-09-09 DIAGNOSIS — K209 Esophagitis, unspecified without bleeding: Secondary | ICD-10-CM | POA: Diagnosis not present

## 2019-09-09 DIAGNOSIS — R079 Chest pain, unspecified: Secondary | ICD-10-CM | POA: Diagnosis not present

## 2019-09-09 DIAGNOSIS — J4520 Mild intermittent asthma, uncomplicated: Secondary | ICD-10-CM | POA: Diagnosis not present

## 2019-09-09 DIAGNOSIS — R509 Fever, unspecified: Secondary | ICD-10-CM | POA: Diagnosis not present

## 2019-09-09 DIAGNOSIS — M1990 Unspecified osteoarthritis, unspecified site: Secondary | ICD-10-CM | POA: Diagnosis not present

## 2019-09-09 DIAGNOSIS — I10 Essential (primary) hypertension: Secondary | ICD-10-CM | POA: Diagnosis not present

## 2019-09-09 DIAGNOSIS — M797 Fibromyalgia: Secondary | ICD-10-CM | POA: Diagnosis not present

## 2019-09-09 DIAGNOSIS — J1289 Other viral pneumonia: Secondary | ICD-10-CM | POA: Diagnosis not present

## 2019-09-09 DIAGNOSIS — G894 Chronic pain syndrome: Secondary | ICD-10-CM | POA: Diagnosis not present

## 2019-09-10 DIAGNOSIS — U071 COVID-19: Secondary | ICD-10-CM | POA: Diagnosis not present

## 2019-09-10 DIAGNOSIS — J020 Streptococcal pharyngitis: Secondary | ICD-10-CM | POA: Diagnosis not present

## 2019-09-10 DIAGNOSIS — I10 Essential (primary) hypertension: Secondary | ICD-10-CM | POA: Diagnosis not present

## 2019-09-10 DIAGNOSIS — E119 Type 2 diabetes mellitus without complications: Secondary | ICD-10-CM | POA: Diagnosis not present

## 2019-09-11 DIAGNOSIS — U071 COVID-19: Secondary | ICD-10-CM | POA: Diagnosis not present

## 2019-09-11 DIAGNOSIS — J020 Streptococcal pharyngitis: Secondary | ICD-10-CM | POA: Diagnosis not present

## 2019-09-11 DIAGNOSIS — I10 Essential (primary) hypertension: Secondary | ICD-10-CM | POA: Diagnosis not present

## 2019-09-11 DIAGNOSIS — E119 Type 2 diabetes mellitus without complications: Secondary | ICD-10-CM | POA: Diagnosis not present

## 2019-09-12 DIAGNOSIS — I10 Essential (primary) hypertension: Secondary | ICD-10-CM | POA: Diagnosis not present

## 2019-09-12 DIAGNOSIS — J020 Streptococcal pharyngitis: Secondary | ICD-10-CM | POA: Diagnosis not present

## 2019-09-12 DIAGNOSIS — U071 COVID-19: Secondary | ICD-10-CM | POA: Diagnosis not present

## 2019-09-12 DIAGNOSIS — E119 Type 2 diabetes mellitus without complications: Secondary | ICD-10-CM | POA: Diagnosis not present

## 2019-09-13 ENCOUNTER — Ambulatory Visit: Payer: BC Managed Care – PPO | Admitting: Cardiology

## 2019-09-21 ENCOUNTER — Ambulatory Visit: Payer: PPO

## 2019-09-22 DIAGNOSIS — G4700 Insomnia, unspecified: Secondary | ICD-10-CM | POA: Diagnosis not present

## 2019-09-22 DIAGNOSIS — U071 COVID-19: Secondary | ICD-10-CM | POA: Diagnosis not present

## 2019-09-22 DIAGNOSIS — I10 Essential (primary) hypertension: Secondary | ICD-10-CM | POA: Diagnosis not present

## 2019-09-22 DIAGNOSIS — J129 Viral pneumonia, unspecified: Secondary | ICD-10-CM | POA: Diagnosis not present

## 2019-09-22 DIAGNOSIS — H109 Unspecified conjunctivitis: Secondary | ICD-10-CM | POA: Diagnosis not present

## 2019-09-22 DIAGNOSIS — R002 Palpitations: Secondary | ICD-10-CM | POA: Diagnosis not present

## 2019-09-22 DIAGNOSIS — Z7689 Persons encountering health services in other specified circumstances: Secondary | ICD-10-CM | POA: Diagnosis not present

## 2019-10-10 DIAGNOSIS — U071 COVID-19: Secondary | ICD-10-CM | POA: Diagnosis not present

## 2019-10-10 DIAGNOSIS — J129 Viral pneumonia, unspecified: Secondary | ICD-10-CM | POA: Diagnosis not present

## 2019-10-10 DIAGNOSIS — D72829 Elevated white blood cell count, unspecified: Secondary | ICD-10-CM | POA: Diagnosis not present

## 2019-10-10 DIAGNOSIS — J1282 Pneumonia due to coronavirus disease 2019: Secondary | ICD-10-CM | POA: Diagnosis not present

## 2019-10-11 ENCOUNTER — Encounter: Payer: Self-pay | Admitting: Cardiology

## 2019-10-11 ENCOUNTER — Other Ambulatory Visit: Payer: Self-pay

## 2019-10-11 ENCOUNTER — Ambulatory Visit (INDEPENDENT_AMBULATORY_CARE_PROVIDER_SITE_OTHER): Payer: PPO | Admitting: Cardiology

## 2019-10-11 VITALS — BP 120/62 | HR 77 | Ht 63.0 in | Wt 235.0 lb

## 2019-10-11 DIAGNOSIS — Z96642 Presence of left artificial hip joint: Secondary | ICD-10-CM | POA: Diagnosis not present

## 2019-10-11 DIAGNOSIS — G4733 Obstructive sleep apnea (adult) (pediatric): Secondary | ICD-10-CM

## 2019-10-11 DIAGNOSIS — I493 Ventricular premature depolarization: Secondary | ICD-10-CM | POA: Diagnosis not present

## 2019-10-11 DIAGNOSIS — Z8616 Personal history of COVID-19: Secondary | ICD-10-CM | POA: Diagnosis not present

## 2019-10-11 DIAGNOSIS — E119 Type 2 diabetes mellitus without complications: Secondary | ICD-10-CM | POA: Diagnosis not present

## 2019-10-11 DIAGNOSIS — I10 Essential (primary) hypertension: Secondary | ICD-10-CM

## 2019-10-11 DIAGNOSIS — Z9989 Dependence on other enabling machines and devices: Secondary | ICD-10-CM
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
## 2019-10-11 NOTE — Patient Instructions (Signed)
Medication Instructions:   *If you need a refill on your cardiac medications before your next appointment, please call your pharmacy*  Lab Work:  If you have labs (blood work) drawn today and your tests are completely normal, you will receive your results only by: Marland Kitchen MyChart Message (if you have MyChart) OR . A paper copy in the mail If you have any lab test that is abnormal or we need to change your treatment, we will call you to review the results.  Follow-Up: At [facility], you and your health needs are our priority.  As part of our continuing mission to provide you with exceptional heart care, we have created designated Provider Care Teams.  These Care Teams include your primary Cardiologist (physician) and Advanced Practice Providers (APPs -  Physician Assistants and Nurse Practitioners) who all work together to provide you with the care you need, when you need it.  We recommend signing up for the patient portal called "MyChart".  Sign up information is provided on this After Visit Summary.  MyChart is used to connect with patients for Virtual Visits (Telemedicine).  Patients are able to view lab/test results, encounter notes, upcoming appointments, etc.  Non-urgent messages can be sent to your provider as well.   To learn more about what you can do with MyChart, go to [email or web address].    Your next appointment:   6 months  The format for your next appointment:   In Person  Provider:    You will see Kardie Tobb, DO.

## 2019-11-16 ENCOUNTER — Other Ambulatory Visit: Payer: Self-pay | Admitting: Internal Medicine

## 2019-11-16 DIAGNOSIS — Z1231 Encounter for screening mammogram for malignant neoplasm of breast: Secondary | ICD-10-CM

## 2019-11-30 DIAGNOSIS — L659 Nonscarring hair loss, unspecified: Secondary | ICD-10-CM | POA: Diagnosis not present

## 2019-11-30 DIAGNOSIS — M797 Fibromyalgia: Secondary | ICD-10-CM | POA: Diagnosis not present

## 2019-11-30 DIAGNOSIS — Z6841 Body Mass Index (BMI) 40.0 and over, adult: Secondary | ICD-10-CM | POA: Diagnosis not present

## 2019-11-30 DIAGNOSIS — N182 Chronic kidney disease, stage 2 (mild): Secondary | ICD-10-CM | POA: Diagnosis not present

## 2019-11-30 DIAGNOSIS — K219 Gastro-esophageal reflux disease without esophagitis: Secondary | ICD-10-CM | POA: Diagnosis not present

## 2019-11-30 DIAGNOSIS — E559 Vitamin D deficiency, unspecified: Secondary | ICD-10-CM | POA: Diagnosis not present

## 2019-11-30 DIAGNOSIS — R748 Abnormal levels of other serum enzymes: Secondary | ICD-10-CM | POA: Diagnosis not present

## 2019-11-30 DIAGNOSIS — E785 Hyperlipidemia, unspecified: Secondary | ICD-10-CM | POA: Diagnosis not present

## 2019-11-30 DIAGNOSIS — E1122 Type 2 diabetes mellitus with diabetic chronic kidney disease: Secondary | ICD-10-CM | POA: Diagnosis not present

## 2019-11-30 DIAGNOSIS — I1310 Hypertensive heart and chronic kidney disease without heart failure, with stage 1 through stage 4 chronic kidney disease, or unspecified chronic kidney disease: Secondary | ICD-10-CM | POA: Diagnosis not present

## 2019-11-30 DIAGNOSIS — M1990 Unspecified osteoarthritis, unspecified site: Secondary | ICD-10-CM | POA: Diagnosis not present

## 2019-12-02 DIAGNOSIS — E538 Deficiency of other specified B group vitamins: Secondary | ICD-10-CM | POA: Diagnosis not present

## 2019-12-06 DIAGNOSIS — Z96642 Presence of left artificial hip joint: Secondary | ICD-10-CM | POA: Diagnosis not present

## 2019-12-06 DIAGNOSIS — M25552 Pain in left hip: Secondary | ICD-10-CM | POA: Diagnosis not present

## 2019-12-07 ENCOUNTER — Ambulatory Visit
Admission: RE | Admit: 2019-12-07 | Discharge: 2019-12-07 | Disposition: A | Discharge status: Home / Self Care | Payer: PPO | Source: Ambulatory Visit | Attending: Internal Medicine | Admitting: Internal Medicine

## 2019-12-07 ENCOUNTER — Other Ambulatory Visit: Payer: Self-pay

## 2019-12-07 DIAGNOSIS — Z1231 Encounter for screening mammogram for malignant neoplasm of breast: Secondary | ICD-10-CM | POA: Diagnosis not present

## 2019-12-09 DIAGNOSIS — E538 Deficiency of other specified B group vitamins: Secondary | ICD-10-CM | POA: Diagnosis not present

## 2019-12-16 DIAGNOSIS — E538 Deficiency of other specified B group vitamins: Secondary | ICD-10-CM | POA: Diagnosis not present

## 2019-12-22 DIAGNOSIS — L03115 Cellulitis of right lower limb: Secondary | ICD-10-CM | POA: Diagnosis not present

## 2019-12-22 DIAGNOSIS — M7989 Other specified soft tissue disorders: Secondary | ICD-10-CM | POA: Diagnosis not present

## 2019-12-22 DIAGNOSIS — M79604 Pain in right leg: Secondary | ICD-10-CM | POA: Diagnosis not present

## 2020-01-02 DIAGNOSIS — G4733 Obstructive sleep apnea (adult) (pediatric): Secondary | ICD-10-CM | POA: Diagnosis not present

## 2020-01-04 DIAGNOSIS — E538 Deficiency of other specified B group vitamins: Secondary | ICD-10-CM | POA: Diagnosis not present

## 2020-01-06 DIAGNOSIS — G4733 Obstructive sleep apnea (adult) (pediatric): Secondary | ICD-10-CM | POA: Diagnosis not present

## 2020-02-06 DIAGNOSIS — E538 Deficiency of other specified B group vitamins: Secondary | ICD-10-CM | POA: Diagnosis not present

## 2020-03-01 DIAGNOSIS — D1801 Hemangioma of skin and subcutaneous tissue: Secondary | ICD-10-CM | POA: Diagnosis not present

## 2020-03-01 DIAGNOSIS — L814 Other melanin hyperpigmentation: Secondary | ICD-10-CM | POA: Diagnosis not present

## 2020-03-01 DIAGNOSIS — D225 Melanocytic nevi of trunk: Secondary | ICD-10-CM | POA: Diagnosis not present

## 2020-03-01 DIAGNOSIS — L821 Other seborrheic keratosis: Secondary | ICD-10-CM | POA: Diagnosis not present

## 2020-03-01 DIAGNOSIS — Z85828 Personal history of other malignant neoplasm of skin: Secondary | ICD-10-CM | POA: Diagnosis not present

## 2020-03-07 DIAGNOSIS — M1990 Unspecified osteoarthritis, unspecified site: Secondary | ICD-10-CM | POA: Diagnosis not present

## 2020-03-07 DIAGNOSIS — N182 Chronic kidney disease, stage 2 (mild): Secondary | ICD-10-CM | POA: Diagnosis not present

## 2020-03-07 DIAGNOSIS — E1122 Type 2 diabetes mellitus with diabetic chronic kidney disease: Secondary | ICD-10-CM | POA: Diagnosis not present

## 2020-03-07 DIAGNOSIS — E559 Vitamin D deficiency, unspecified: Secondary | ICD-10-CM | POA: Diagnosis not present

## 2020-03-07 DIAGNOSIS — Q210 Ventricular septal defect: Secondary | ICD-10-CM | POA: Diagnosis not present

## 2020-03-07 DIAGNOSIS — R748 Abnormal levels of other serum enzymes: Secondary | ICD-10-CM | POA: Diagnosis not present

## 2020-03-07 DIAGNOSIS — E785 Hyperlipidemia, unspecified: Secondary | ICD-10-CM | POA: Diagnosis not present

## 2020-03-07 DIAGNOSIS — K219 Gastro-esophageal reflux disease without esophagitis: Secondary | ICD-10-CM | POA: Diagnosis not present

## 2020-03-07 DIAGNOSIS — R609 Edema, unspecified: Secondary | ICD-10-CM | POA: Diagnosis not present

## 2020-03-07 DIAGNOSIS — M797 Fibromyalgia: Secondary | ICD-10-CM | POA: Diagnosis not present

## 2020-03-07 DIAGNOSIS — I1310 Hypertensive heart and chronic kidney disease without heart failure, with stage 1 through stage 4 chronic kidney disease, or unspecified chronic kidney disease: Secondary | ICD-10-CM | POA: Diagnosis not present

## 2020-04-05 DIAGNOSIS — G4733 Obstructive sleep apnea (adult) (pediatric): Secondary | ICD-10-CM | POA: Diagnosis not present

## 2020-04-26 DIAGNOSIS — M961 Postlaminectomy syndrome, not elsewhere classified: Secondary | ICD-10-CM | POA: Diagnosis not present

## 2020-04-26 DIAGNOSIS — G894 Chronic pain syndrome: Secondary | ICD-10-CM | POA: Diagnosis not present

## 2020-06-06 DIAGNOSIS — I1310 Hypertensive heart and chronic kidney disease without heart failure, with stage 1 through stage 4 chronic kidney disease, or unspecified chronic kidney disease: Secondary | ICD-10-CM | POA: Diagnosis not present

## 2020-06-06 DIAGNOSIS — M797 Fibromyalgia: Secondary | ICD-10-CM | POA: Diagnosis not present

## 2020-06-06 DIAGNOSIS — E1122 Type 2 diabetes mellitus with diabetic chronic kidney disease: Secondary | ICD-10-CM | POA: Diagnosis not present

## 2020-06-06 DIAGNOSIS — E785 Hyperlipidemia, unspecified: Secondary | ICD-10-CM | POA: Diagnosis not present

## 2020-06-06 DIAGNOSIS — N182 Chronic kidney disease, stage 2 (mild): Secondary | ICD-10-CM | POA: Diagnosis not present

## 2020-06-06 DIAGNOSIS — K219 Gastro-esophageal reflux disease without esophagitis: Secondary | ICD-10-CM | POA: Diagnosis not present

## 2020-06-06 DIAGNOSIS — M1990 Unspecified osteoarthritis, unspecified site: Secondary | ICD-10-CM | POA: Diagnosis not present

## 2020-06-06 DIAGNOSIS — Q210 Ventricular septal defect: Secondary | ICD-10-CM | POA: Diagnosis not present

## 2020-06-06 DIAGNOSIS — E559 Vitamin D deficiency, unspecified: Secondary | ICD-10-CM | POA: Diagnosis not present

## 2020-06-06 DIAGNOSIS — R748 Abnormal levels of other serum enzymes: Secondary | ICD-10-CM | POA: Diagnosis not present

## 2020-06-06 DIAGNOSIS — Z23 Encounter for immunization: Secondary | ICD-10-CM | POA: Diagnosis not present

## 2020-06-12 DIAGNOSIS — R748 Abnormal levels of other serum enzymes: Secondary | ICD-10-CM | POA: Diagnosis not present

## 2020-06-12 DIAGNOSIS — R945 Abnormal results of liver function studies: Secondary | ICD-10-CM | POA: Diagnosis not present

## 2020-07-03 ENCOUNTER — Other Ambulatory Visit: Payer: Self-pay

## 2020-07-03 DIAGNOSIS — R1310 Dysphagia, unspecified: Secondary | ICD-10-CM | POA: Insufficient documentation

## 2020-07-03 DIAGNOSIS — M5136 Other intervertebral disc degeneration, lumbar region: Secondary | ICD-10-CM | POA: Insufficient documentation

## 2020-07-03 DIAGNOSIS — Z86010 Personal history of colonic polyps: Secondary | ICD-10-CM | POA: Insufficient documentation

## 2020-07-03 DIAGNOSIS — K5900 Constipation, unspecified: Secondary | ICD-10-CM | POA: Insufficient documentation

## 2020-07-03 DIAGNOSIS — G4730 Sleep apnea, unspecified: Secondary | ICD-10-CM | POA: Insufficient documentation

## 2020-07-03 DIAGNOSIS — E119 Type 2 diabetes mellitus without complications: Secondary | ICD-10-CM | POA: Insufficient documentation

## 2020-07-03 DIAGNOSIS — E559 Vitamin D deficiency, unspecified: Secondary | ICD-10-CM | POA: Insufficient documentation

## 2020-07-03 DIAGNOSIS — W3400XA Accidental discharge from unspecified firearms or gun, initial encounter: Secondary | ICD-10-CM | POA: Insufficient documentation

## 2020-07-03 DIAGNOSIS — M1990 Unspecified osteoarthritis, unspecified site: Secondary | ICD-10-CM | POA: Insufficient documentation

## 2020-07-03 DIAGNOSIS — I872 Venous insufficiency (chronic) (peripheral): Secondary | ICD-10-CM | POA: Insufficient documentation

## 2020-07-03 DIAGNOSIS — N289 Disorder of kidney and ureter, unspecified: Secondary | ICD-10-CM | POA: Insufficient documentation

## 2020-07-03 DIAGNOSIS — G8929 Other chronic pain: Secondary | ICD-10-CM | POA: Insufficient documentation

## 2020-07-03 DIAGNOSIS — M722 Plantar fascial fibromatosis: Secondary | ICD-10-CM | POA: Insufficient documentation

## 2020-07-03 DIAGNOSIS — I10 Essential (primary) hypertension: Secondary | ICD-10-CM | POA: Insufficient documentation

## 2020-07-03 DIAGNOSIS — M797 Fibromyalgia: Secondary | ICD-10-CM | POA: Insufficient documentation

## 2020-07-03 DIAGNOSIS — Q210 Ventricular septal defect: Secondary | ICD-10-CM | POA: Insufficient documentation

## 2020-07-03 DIAGNOSIS — K760 Fatty (change of) liver, not elsewhere classified: Secondary | ICD-10-CM | POA: Insufficient documentation

## 2020-07-03 DIAGNOSIS — R609 Edema, unspecified: Secondary | ICD-10-CM | POA: Insufficient documentation

## 2020-07-03 DIAGNOSIS — C4490 Unspecified malignant neoplasm of skin, unspecified: Secondary | ICD-10-CM | POA: Insufficient documentation

## 2020-07-03 DIAGNOSIS — N059 Unspecified nephritic syndrome with unspecified morphologic changes: Secondary | ICD-10-CM | POA: Insufficient documentation

## 2020-07-03 DIAGNOSIS — K589 Irritable bowel syndrome without diarrhea: Secondary | ICD-10-CM | POA: Insufficient documentation

## 2020-07-03 DIAGNOSIS — K219 Gastro-esophageal reflux disease without esophagitis: Secondary | ICD-10-CM | POA: Insufficient documentation

## 2020-07-03 DIAGNOSIS — M2550 Pain in unspecified joint: Secondary | ICD-10-CM | POA: Insufficient documentation

## 2020-07-03 DIAGNOSIS — R5382 Chronic fatigue, unspecified: Secondary | ICD-10-CM | POA: Insufficient documentation

## 2020-07-03 DIAGNOSIS — G9332 Myalgic encephalomyelitis/chronic fatigue syndrome: Secondary | ICD-10-CM | POA: Insufficient documentation

## 2020-07-03 DIAGNOSIS — M7989 Other specified soft tissue disorders: Secondary | ICD-10-CM | POA: Insufficient documentation

## 2020-07-05 ENCOUNTER — Other Ambulatory Visit: Payer: Self-pay

## 2020-07-05 ENCOUNTER — Encounter: Payer: Self-pay | Admitting: Cardiology

## 2020-07-05 ENCOUNTER — Ambulatory Visit (INDEPENDENT_AMBULATORY_CARE_PROVIDER_SITE_OTHER): Payer: PPO | Admitting: Cardiology

## 2020-07-05 VITALS — BP 130/72 | HR 71 | Ht 64.0 in | Wt 249.2 lb

## 2020-07-05 DIAGNOSIS — I10 Essential (primary) hypertension: Secondary | ICD-10-CM

## 2020-07-05 DIAGNOSIS — I493 Ventricular premature depolarization: Secondary | ICD-10-CM | POA: Diagnosis not present

## 2020-07-05 DIAGNOSIS — M7989 Other specified soft tissue disorders: Secondary | ICD-10-CM | POA: Diagnosis not present

## 2020-07-05 NOTE — Patient Instructions (Addendum)
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
## 2020-07-27 DIAGNOSIS — G4733 Obstructive sleep apnea (adult) (pediatric): Secondary | ICD-10-CM | POA: Diagnosis not present

## 2020-07-31 DIAGNOSIS — Z1331 Encounter for screening for depression: Secondary | ICD-10-CM | POA: Diagnosis not present

## 2020-07-31 DIAGNOSIS — Z0000 Encounter for general adult medical examination without abnormal findings: Secondary | ICD-10-CM | POA: Diagnosis not present

## 2020-07-31 DIAGNOSIS — E669 Obesity, unspecified: Secondary | ICD-10-CM | POA: Diagnosis not present

## 2020-07-31 DIAGNOSIS — Z139 Encounter for screening, unspecified: Secondary | ICD-10-CM | POA: Diagnosis not present

## 2020-07-31 DIAGNOSIS — Z9181 History of falling: Secondary | ICD-10-CM | POA: Diagnosis not present

## 2020-07-31 DIAGNOSIS — E785 Hyperlipidemia, unspecified: Secondary | ICD-10-CM | POA: Diagnosis not present

## 2020-08-21 DIAGNOSIS — H25043 Posterior subcapsular polar age-related cataract, bilateral: Secondary | ICD-10-CM | POA: Diagnosis not present

## 2020-08-21 DIAGNOSIS — H2513 Age-related nuclear cataract, bilateral: Secondary | ICD-10-CM | POA: Diagnosis not present

## 2020-08-21 DIAGNOSIS — H18413 Arcus senilis, bilateral: Secondary | ICD-10-CM | POA: Diagnosis not present

## 2020-08-21 DIAGNOSIS — H25013 Cortical age-related cataract, bilateral: Secondary | ICD-10-CM | POA: Diagnosis not present

## 2020-08-21 DIAGNOSIS — H2511 Age-related nuclear cataract, right eye: Secondary | ICD-10-CM | POA: Diagnosis not present

## 2020-09-04 DIAGNOSIS — M1990 Unspecified osteoarthritis, unspecified site: Secondary | ICD-10-CM | POA: Diagnosis not present

## 2020-09-04 DIAGNOSIS — R609 Edema, unspecified: Secondary | ICD-10-CM | POA: Diagnosis not present

## 2020-09-04 DIAGNOSIS — E559 Vitamin D deficiency, unspecified: Secondary | ICD-10-CM | POA: Diagnosis not present

## 2020-09-04 DIAGNOSIS — E785 Hyperlipidemia, unspecified: Secondary | ICD-10-CM | POA: Diagnosis not present

## 2020-09-04 DIAGNOSIS — M797 Fibromyalgia: Secondary | ICD-10-CM | POA: Diagnosis not present

## 2020-09-04 DIAGNOSIS — I130 Hypertensive heart and chronic kidney disease with heart failure and stage 1 through stage 4 chronic kidney disease, or unspecified chronic kidney disease: Secondary | ICD-10-CM | POA: Diagnosis not present

## 2020-09-04 DIAGNOSIS — R748 Abnormal levels of other serum enzymes: Secondary | ICD-10-CM | POA: Diagnosis not present

## 2020-09-04 DIAGNOSIS — I1310 Hypertensive heart and chronic kidney disease without heart failure, with stage 1 through stage 4 chronic kidney disease, or unspecified chronic kidney disease: Secondary | ICD-10-CM | POA: Diagnosis not present

## 2020-09-04 DIAGNOSIS — E1122 Type 2 diabetes mellitus with diabetic chronic kidney disease: Secondary | ICD-10-CM | POA: Diagnosis not present

## 2020-09-04 DIAGNOSIS — N182 Chronic kidney disease, stage 2 (mild): Secondary | ICD-10-CM | POA: Diagnosis not present

## 2020-09-04 DIAGNOSIS — K219 Gastro-esophageal reflux disease without esophagitis: Secondary | ICD-10-CM | POA: Diagnosis not present

## 2020-09-04 DIAGNOSIS — Q210 Ventricular septal defect: Secondary | ICD-10-CM | POA: Diagnosis not present

## 2020-09-24 DIAGNOSIS — H52201 Unspecified astigmatism, right eye: Secondary | ICD-10-CM | POA: Diagnosis not present

## 2020-09-24 DIAGNOSIS — H2511 Age-related nuclear cataract, right eye: Secondary | ICD-10-CM | POA: Diagnosis not present

## 2020-09-25 DIAGNOSIS — H2512 Age-related nuclear cataract, left eye: Secondary | ICD-10-CM | POA: Diagnosis not present

## 2020-10-08 DIAGNOSIS — H2512 Age-related nuclear cataract, left eye: Secondary | ICD-10-CM | POA: Diagnosis not present

## 2020-10-08 DIAGNOSIS — H52202 Unspecified astigmatism, left eye: Secondary | ICD-10-CM | POA: Diagnosis not present

## 2020-11-01 DIAGNOSIS — M545 Low back pain, unspecified: Secondary | ICD-10-CM | POA: Diagnosis not present

## 2020-11-01 DIAGNOSIS — M7062 Trochanteric bursitis, left hip: Secondary | ICD-10-CM | POA: Diagnosis not present

## 2020-11-01 DIAGNOSIS — Z96642 Presence of left artificial hip joint: Secondary | ICD-10-CM | POA: Diagnosis not present

## 2020-11-01 DIAGNOSIS — M67911 Unspecified disorder of synovium and tendon, right shoulder: Secondary | ICD-10-CM | POA: Diagnosis not present

## 2020-11-21 DIAGNOSIS — R059 Cough, unspecified: Secondary | ICD-10-CM | POA: Diagnosis not present

## 2020-11-21 DIAGNOSIS — Z6841 Body Mass Index (BMI) 40.0 and over, adult: Secondary | ICD-10-CM | POA: Diagnosis not present

## 2020-11-21 DIAGNOSIS — J029 Acute pharyngitis, unspecified: Secondary | ICD-10-CM | POA: Diagnosis not present

## 2020-11-21 DIAGNOSIS — J3489 Other specified disorders of nose and nasal sinuses: Secondary | ICD-10-CM | POA: Diagnosis not present

## 2020-11-27 DIAGNOSIS — G4733 Obstructive sleep apnea (adult) (pediatric): Secondary | ICD-10-CM | POA: Diagnosis not present

## 2020-12-12 DIAGNOSIS — M1990 Unspecified osteoarthritis, unspecified site: Secondary | ICD-10-CM | POA: Diagnosis not present

## 2020-12-12 DIAGNOSIS — E559 Vitamin D deficiency, unspecified: Secondary | ICD-10-CM | POA: Diagnosis not present

## 2020-12-12 DIAGNOSIS — K219 Gastro-esophageal reflux disease without esophagitis: Secondary | ICD-10-CM | POA: Diagnosis not present

## 2020-12-12 DIAGNOSIS — E785 Hyperlipidemia, unspecified: Secondary | ICD-10-CM | POA: Diagnosis not present

## 2020-12-12 DIAGNOSIS — E1122 Type 2 diabetes mellitus with diabetic chronic kidney disease: Secondary | ICD-10-CM | POA: Diagnosis not present

## 2020-12-12 DIAGNOSIS — N182 Chronic kidney disease, stage 2 (mild): Secondary | ICD-10-CM | POA: Diagnosis not present

## 2020-12-12 DIAGNOSIS — R748 Abnormal levels of other serum enzymes: Secondary | ICD-10-CM | POA: Diagnosis not present

## 2020-12-12 DIAGNOSIS — M797 Fibromyalgia: Secondary | ICD-10-CM | POA: Diagnosis not present

## 2020-12-12 DIAGNOSIS — R609 Edema, unspecified: Secondary | ICD-10-CM | POA: Diagnosis not present

## 2020-12-12 DIAGNOSIS — I1310 Hypertensive heart and chronic kidney disease without heart failure, with stage 1 through stage 4 chronic kidney disease, or unspecified chronic kidney disease: Secondary | ICD-10-CM | POA: Diagnosis not present

## 2020-12-12 DIAGNOSIS — Q210 Ventricular septal defect: Secondary | ICD-10-CM | POA: Diagnosis not present

## 2020-12-17 ENCOUNTER — Other Ambulatory Visit: Payer: Self-pay | Admitting: Physician Assistant

## 2020-12-17 DIAGNOSIS — R5381 Other malaise: Secondary | ICD-10-CM

## 2020-12-17 DIAGNOSIS — Z1231 Encounter for screening mammogram for malignant neoplasm of breast: Secondary | ICD-10-CM

## 2020-12-17 DIAGNOSIS — M8589 Other specified disorders of bone density and structure, multiple sites: Secondary | ICD-10-CM

## 2020-12-20 IMAGING — DX DG CHEST 2V
2 series · 2 of 2 positions shown · non-contrast
Comparison: Chest x-ray dated 04/15/2018.

CLINICAL DATA: Preoperative LEFT hip replacement.

EXAM:
CHEST - 2 VIEW

[chest pa]
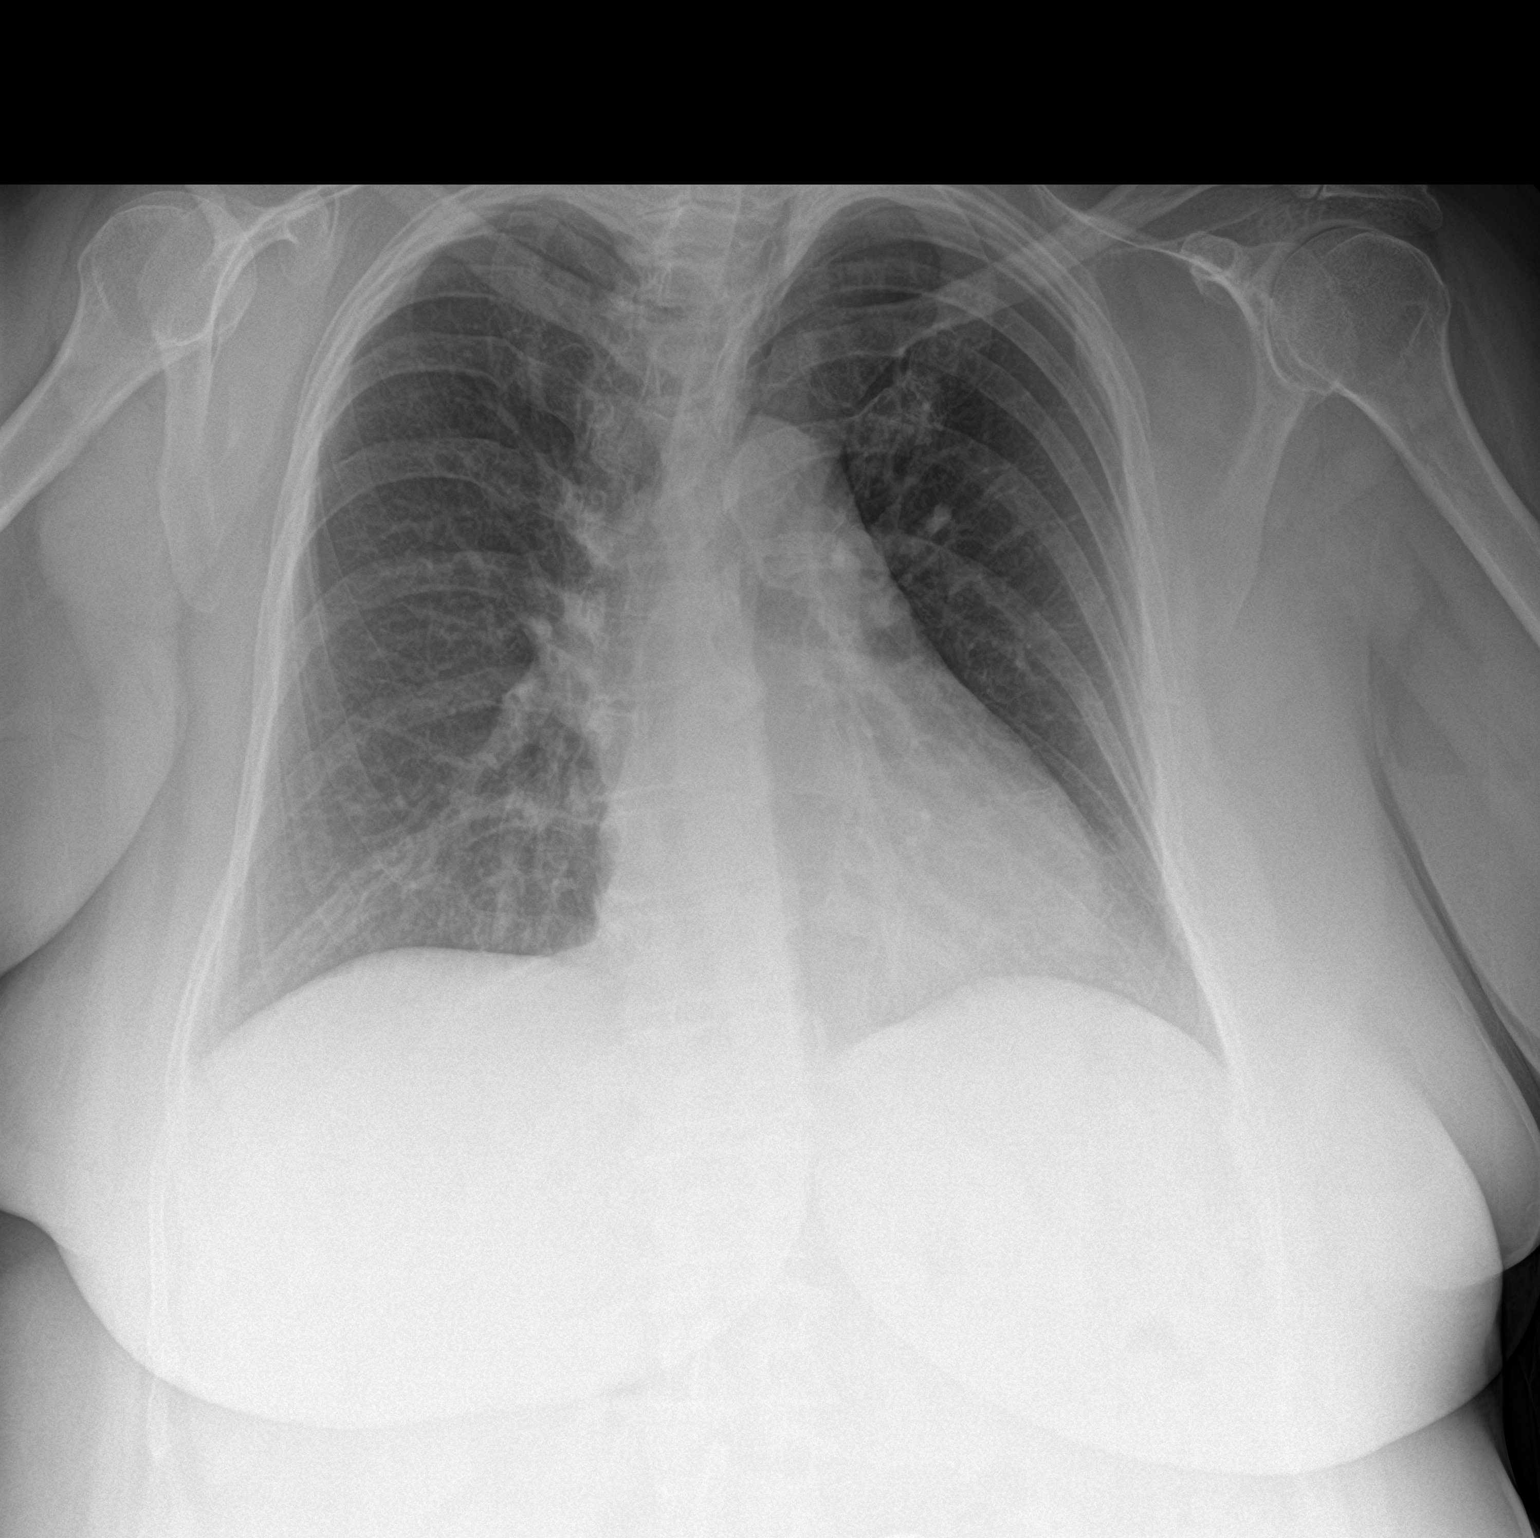

[chest lat]
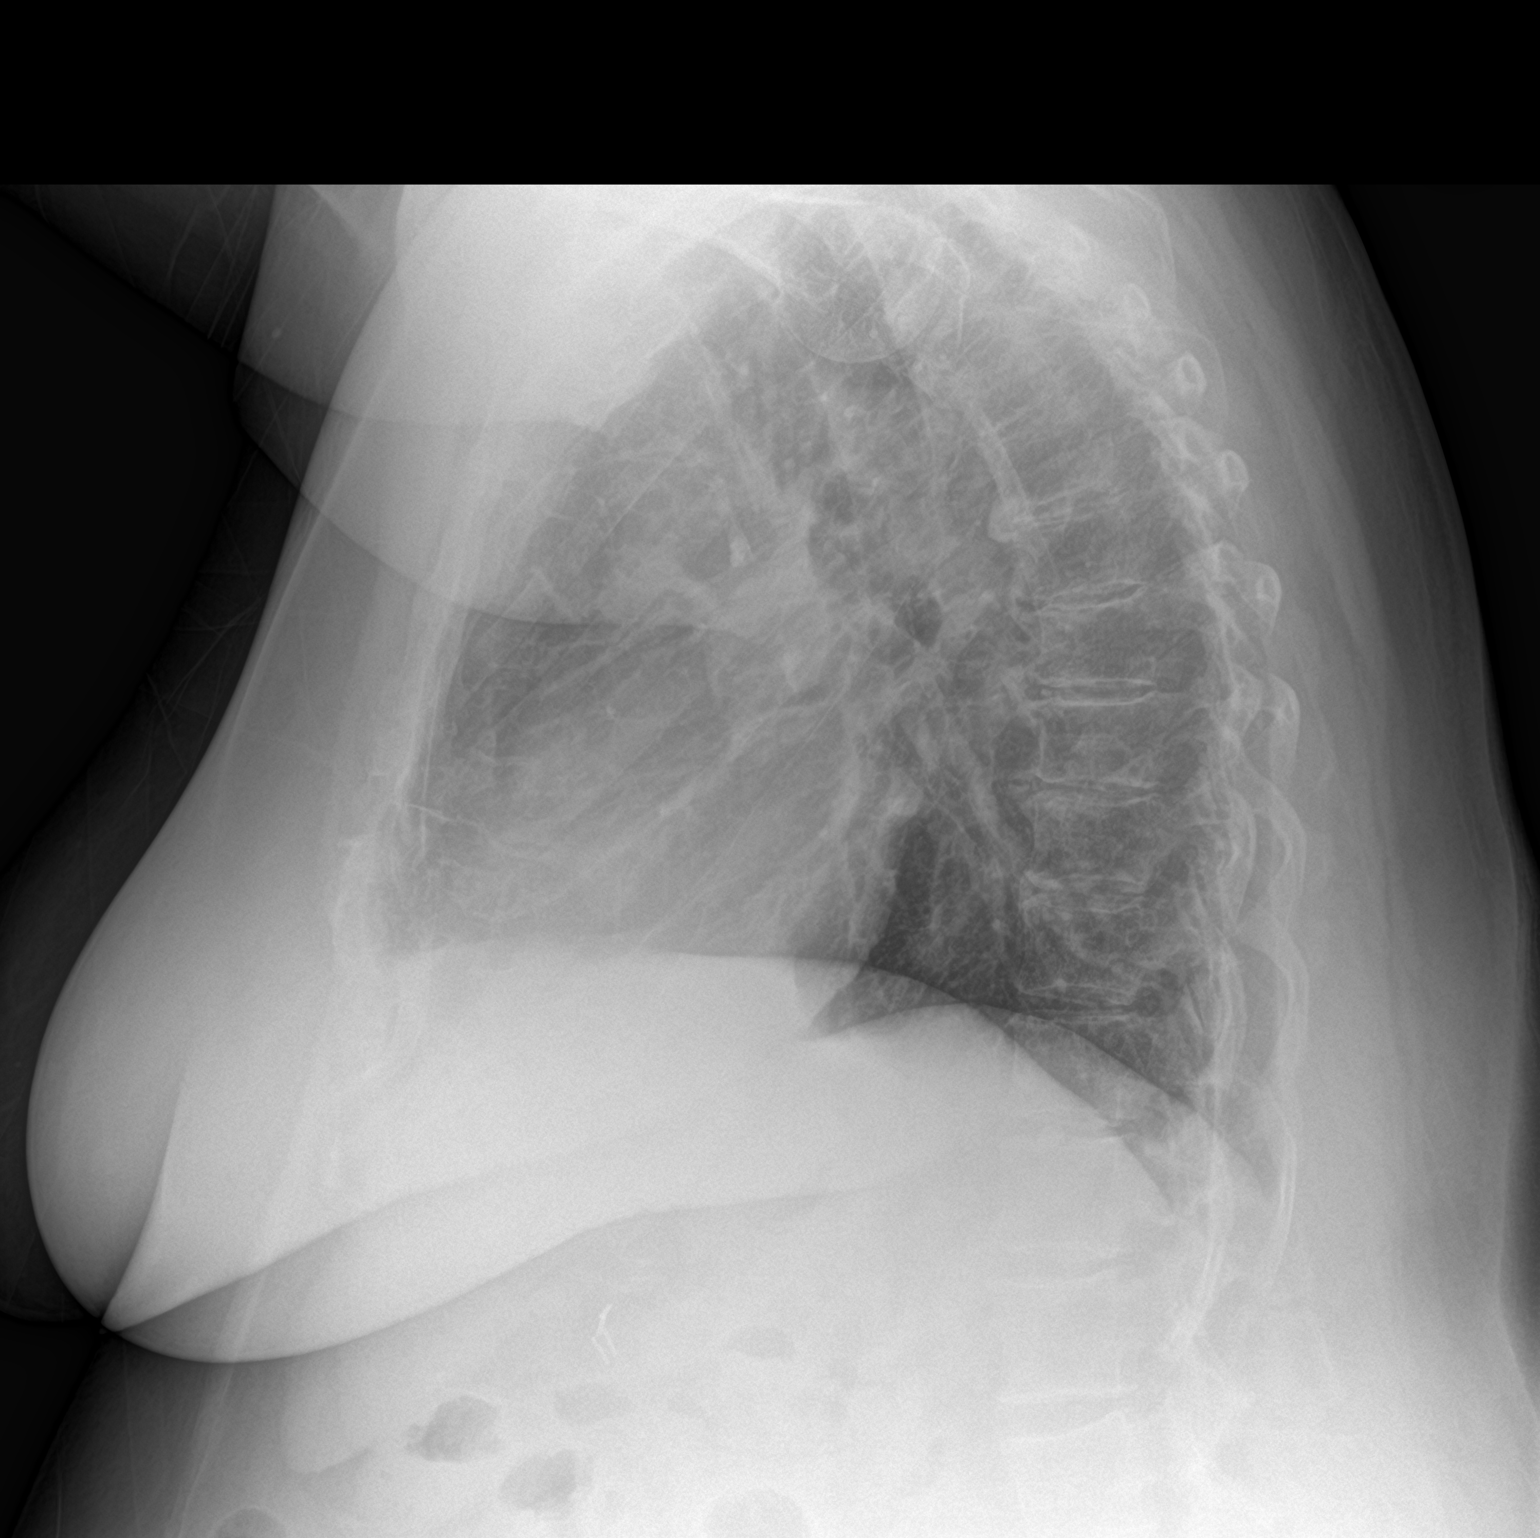

[2 of 2 positions shown; findings below may reference images not displayed]

FINDINGS: Heart size and mediastinal contours are within normal limits. Lungs
are clear. No pleural effusions seen. Mild degenerative spondylosis
of the thoracic spine. No acute or suspicious osseous finding.
IMPRESSION: No active cardiopulmonary disease. No evidence of pneumonia or
pulmonary edema.

## 2020-12-23 IMAGING — RF DG HIP (WITH PELVIS) OPERATIVE*L*
1 series · 2 of 2 positions shown · non-contrast
Comparison: None.

CLINICAL DATA: Left hip replacement.

EXAM:
OPERATIVE left HIP (WITH PELVIS IF PERFORMED) 2 VIEWS
TECHNIQUE: Fluoroscopic spot image(s) were submitted for interpretation
post-operatively.

[Series 1: run · 2 of 2 slices shown]
[im 1/2]
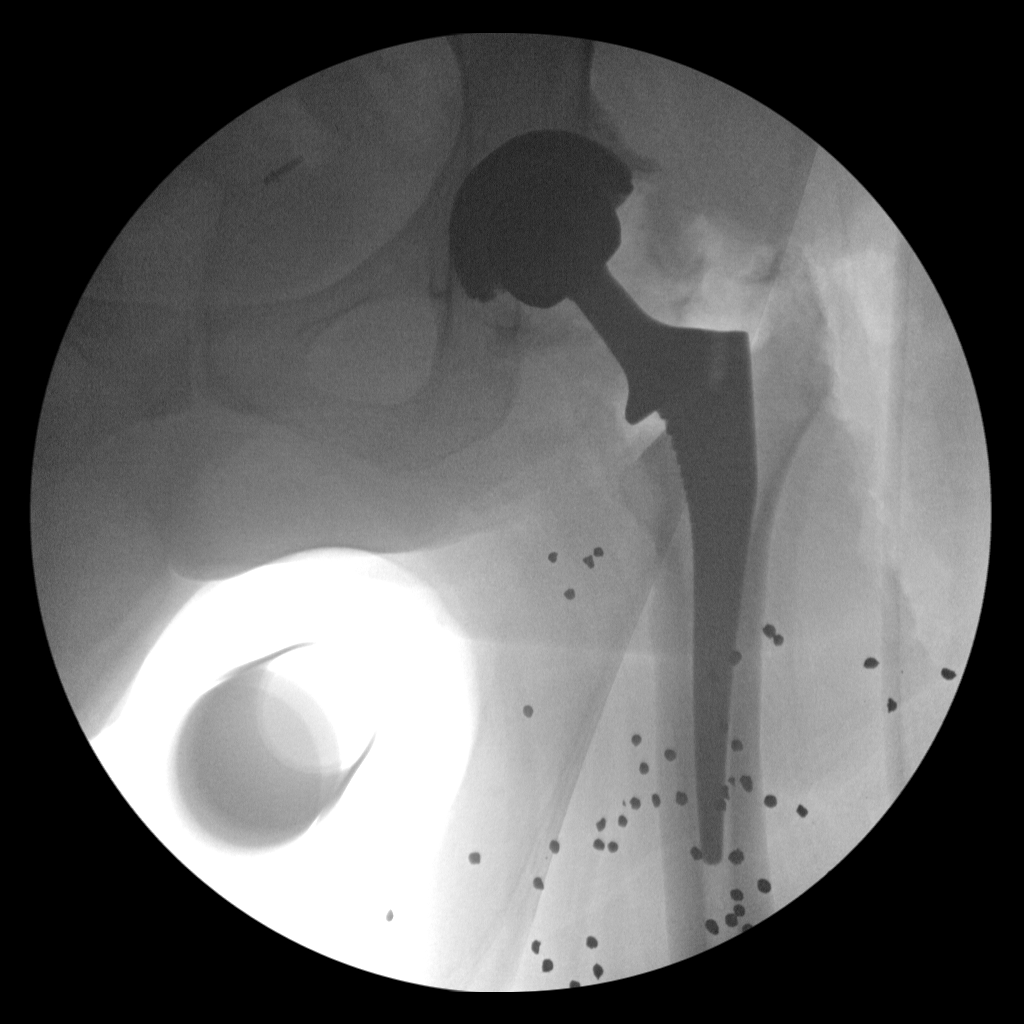
[im 2/2]
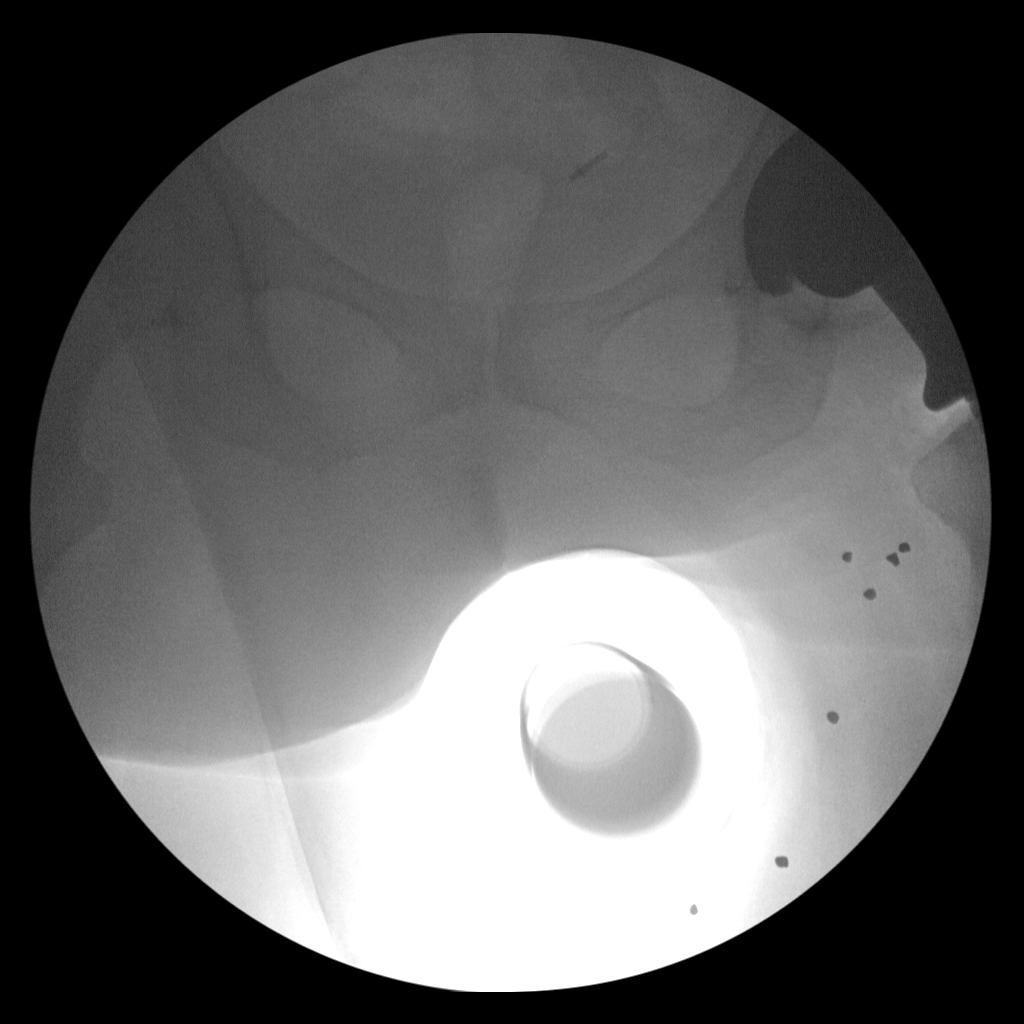

[2 of 2 positions shown; findings below may reference images not displayed]

FINDINGS: Examination demonstrates placement of left total hip arthroplasty.
Acetabular component intact. There is approximately 4 mm of space
between the medial aspect of the femoral neck component of the
prosthesis with the adjacent trochanteric bone. Multiple metallic
fragments over the soft tissues of the left upper leg.
IMPRESSION: Evidence of patient's recent left total hip arthroplasty as
described.

## 2020-12-23 IMAGING — RF DG C-ARM 1-60 MIN-NO REPORT
1 series · 2 of 2 positions shown · non-contrast
Comparison: None.

CLINICAL DATA: Left hip replacement.

EXAM:
OPERATIVE left HIP (WITH PELVIS IF PERFORMED) 2 VIEWS
TECHNIQUE: Fluoroscopic spot image(s) were submitted for interpretation
post-operatively.

[Series 1: run · 2 of 2 slices shown]
[im 1/2]
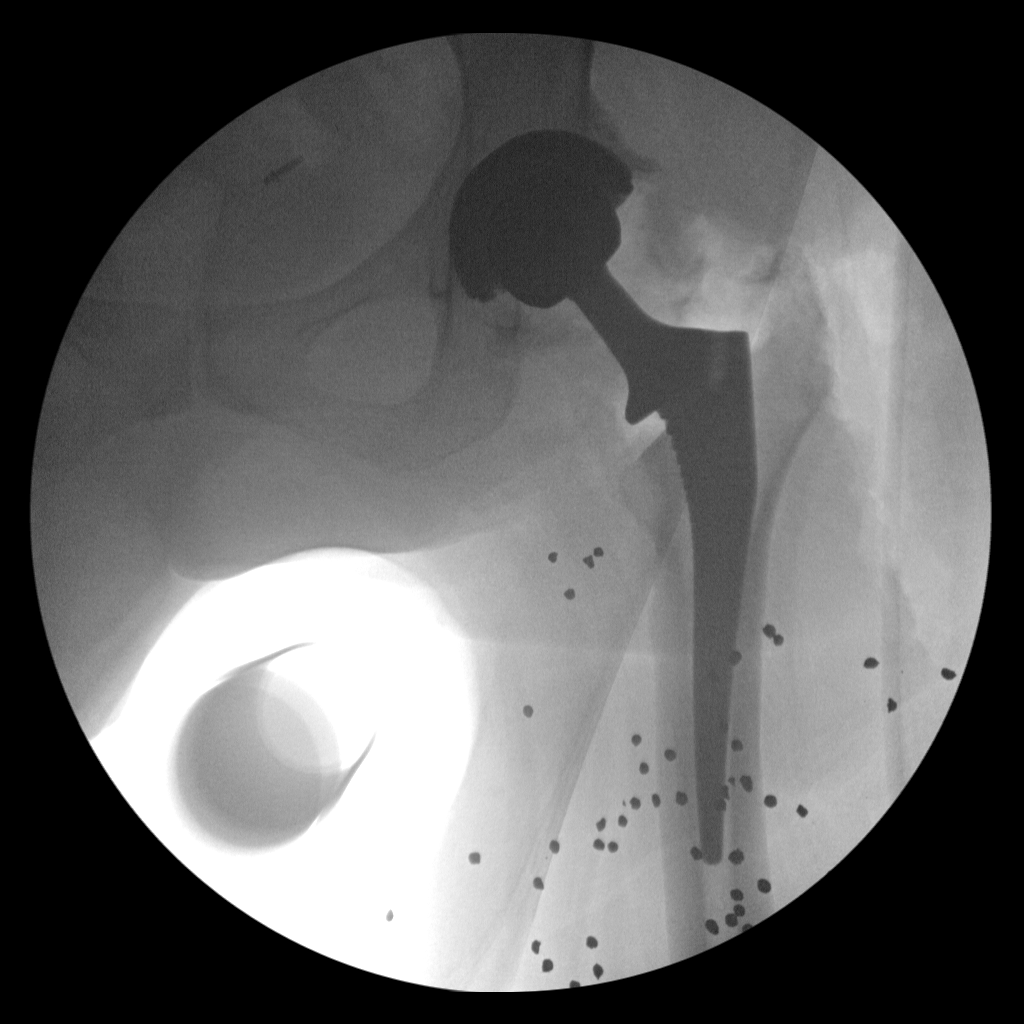
[im 2/2]
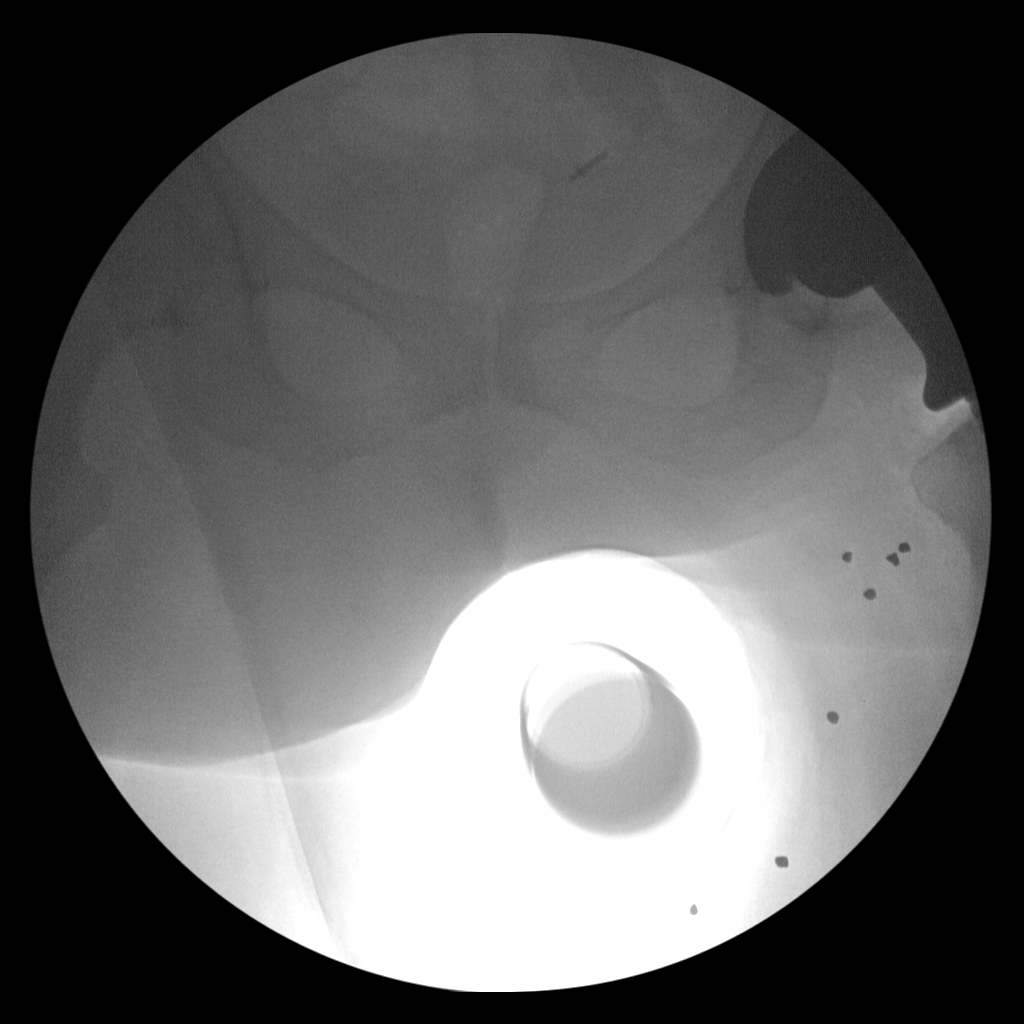

[2 of 2 positions shown; findings below may reference images not displayed]

FINDINGS: Examination demonstrates placement of left total hip arthroplasty.
Acetabular component intact. There is approximately 4 mm of space
between the medial aspect of the femoral neck component of the
prosthesis with the adjacent trochanteric bone. Multiple metallic
fragments over the soft tissues of the left upper leg.
IMPRESSION: Evidence of patient's recent left total hip arthroplasty as
described.

## 2021-01-08 ENCOUNTER — Other Ambulatory Visit: Payer: Self-pay

## 2021-01-08 ENCOUNTER — Ambulatory Visit
Admission: RE | Admit: 2021-01-08 | Discharge: 2021-01-08 | Disposition: A | Discharge status: Home / Self Care | Payer: PPO | Source: Ambulatory Visit | Attending: Physician Assistant | Admitting: Physician Assistant

## 2021-01-08 DIAGNOSIS — Z1231 Encounter for screening mammogram for malignant neoplasm of breast: Secondary | ICD-10-CM | POA: Diagnosis not present

## 2021-01-14 DIAGNOSIS — Z79899 Other long term (current) drug therapy: Secondary | ICD-10-CM | POA: Diagnosis not present

## 2021-01-14 DIAGNOSIS — E8941 Symptomatic postprocedural ovarian failure: Secondary | ICD-10-CM | POA: Diagnosis not present

## 2021-01-14 DIAGNOSIS — G479 Sleep disorder, unspecified: Secondary | ICD-10-CM | POA: Diagnosis not present

## 2021-01-23 DIAGNOSIS — E8941 Symptomatic postprocedural ovarian failure: Secondary | ICD-10-CM | POA: Diagnosis not present

## 2021-01-23 DIAGNOSIS — G479 Sleep disorder, unspecified: Secondary | ICD-10-CM | POA: Diagnosis not present

## 2021-03-07 DIAGNOSIS — Z85828 Personal history of other malignant neoplasm of skin: Secondary | ICD-10-CM | POA: Diagnosis not present

## 2021-03-07 DIAGNOSIS — L820 Inflamed seborrheic keratosis: Secondary | ICD-10-CM | POA: Diagnosis not present

## 2021-03-07 DIAGNOSIS — L2089 Other atopic dermatitis: Secondary | ICD-10-CM | POA: Diagnosis not present

## 2021-03-07 DIAGNOSIS — L821 Other seborrheic keratosis: Secondary | ICD-10-CM | POA: Diagnosis not present

## 2021-03-07 DIAGNOSIS — L738 Other specified follicular disorders: Secondary | ICD-10-CM | POA: Diagnosis not present

## 2021-03-13 DIAGNOSIS — G4733 Obstructive sleep apnea (adult) (pediatric): Secondary | ICD-10-CM | POA: Diagnosis not present

## 2021-03-18 DIAGNOSIS — I1310 Hypertensive heart and chronic kidney disease without heart failure, with stage 1 through stage 4 chronic kidney disease, or unspecified chronic kidney disease: Secondary | ICD-10-CM | POA: Diagnosis not present

## 2021-03-18 DIAGNOSIS — N182 Chronic kidney disease, stage 2 (mild): Secondary | ICD-10-CM | POA: Diagnosis not present

## 2021-03-18 DIAGNOSIS — M797 Fibromyalgia: Secondary | ICD-10-CM | POA: Diagnosis not present

## 2021-03-18 DIAGNOSIS — E559 Vitamin D deficiency, unspecified: Secondary | ICD-10-CM | POA: Diagnosis not present

## 2021-03-18 DIAGNOSIS — M1990 Unspecified osteoarthritis, unspecified site: Secondary | ICD-10-CM | POA: Diagnosis not present

## 2021-03-18 DIAGNOSIS — K219 Gastro-esophageal reflux disease without esophagitis: Secondary | ICD-10-CM | POA: Diagnosis not present

## 2021-03-18 DIAGNOSIS — E1122 Type 2 diabetes mellitus with diabetic chronic kidney disease: Secondary | ICD-10-CM | POA: Diagnosis not present

## 2021-03-18 DIAGNOSIS — R748 Abnormal levels of other serum enzymes: Secondary | ICD-10-CM | POA: Diagnosis not present

## 2021-03-18 DIAGNOSIS — R609 Edema, unspecified: Secondary | ICD-10-CM | POA: Diagnosis not present

## 2021-03-18 DIAGNOSIS — E785 Hyperlipidemia, unspecified: Secondary | ICD-10-CM | POA: Diagnosis not present

## 2021-03-18 DIAGNOSIS — Q210 Ventricular septal defect: Secondary | ICD-10-CM | POA: Diagnosis not present

## 2021-03-20 DIAGNOSIS — D124 Benign neoplasm of descending colon: Secondary | ICD-10-CM | POA: Diagnosis not present

## 2021-03-20 DIAGNOSIS — Z86010 Personal history of colonic polyps: Secondary | ICD-10-CM | POA: Diagnosis not present

## 2021-03-20 DIAGNOSIS — D120 Benign neoplasm of cecum: Secondary | ICD-10-CM | POA: Diagnosis not present

## 2021-03-20 DIAGNOSIS — Z1211 Encounter for screening for malignant neoplasm of colon: Secondary | ICD-10-CM | POA: Diagnosis not present

## 2021-03-20 DIAGNOSIS — K635 Polyp of colon: Secondary | ICD-10-CM | POA: Diagnosis not present

## 2021-06-11 DIAGNOSIS — G4733 Obstructive sleep apnea (adult) (pediatric): Secondary | ICD-10-CM | POA: Diagnosis not present

## 2021-06-19 DIAGNOSIS — R748 Abnormal levels of other serum enzymes: Secondary | ICD-10-CM | POA: Diagnosis not present

## 2021-06-19 DIAGNOSIS — M1990 Unspecified osteoarthritis, unspecified site: Secondary | ICD-10-CM | POA: Diagnosis not present

## 2021-06-19 DIAGNOSIS — R609 Edema, unspecified: Secondary | ICD-10-CM | POA: Diagnosis not present

## 2021-06-19 DIAGNOSIS — K219 Gastro-esophageal reflux disease without esophagitis: Secondary | ICD-10-CM | POA: Diagnosis not present

## 2021-06-19 DIAGNOSIS — E559 Vitamin D deficiency, unspecified: Secondary | ICD-10-CM | POA: Diagnosis not present

## 2021-06-19 DIAGNOSIS — N951 Menopausal and female climacteric states: Secondary | ICD-10-CM | POA: Diagnosis not present

## 2021-06-19 DIAGNOSIS — E785 Hyperlipidemia, unspecified: Secondary | ICD-10-CM | POA: Diagnosis not present

## 2021-06-19 DIAGNOSIS — E1122 Type 2 diabetes mellitus with diabetic chronic kidney disease: Secondary | ICD-10-CM | POA: Diagnosis not present

## 2021-06-19 DIAGNOSIS — M797 Fibromyalgia: Secondary | ICD-10-CM | POA: Diagnosis not present

## 2021-06-19 DIAGNOSIS — N182 Chronic kidney disease, stage 2 (mild): Secondary | ICD-10-CM | POA: Diagnosis not present

## 2021-06-19 DIAGNOSIS — I1310 Hypertensive heart and chronic kidney disease without heart failure, with stage 1 through stage 4 chronic kidney disease, or unspecified chronic kidney disease: Secondary | ICD-10-CM | POA: Diagnosis not present

## 2021-07-05 IMAGING — MG DIGITAL SCREENING BILAT W/ TOMO W/ CAD
6 of 10 series · 6 of 30 positions shown · non-contrast
Comparison: Previous exam(s).

CLINICAL DATA: Screening.

EXAM:
DIGITAL SCREENING BILATERAL MAMMOGRAM WITH TOMO AND CAD

[R MLO synth-2D]
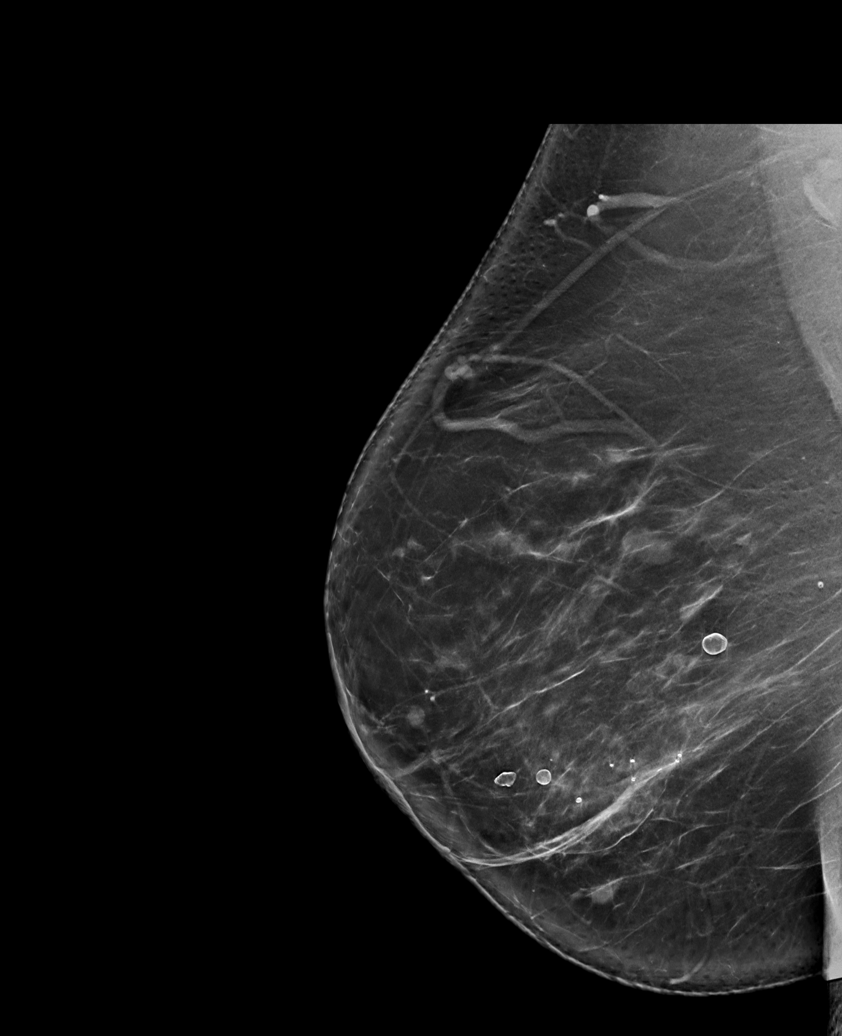

[L CC synth-2D (1 of 2)]
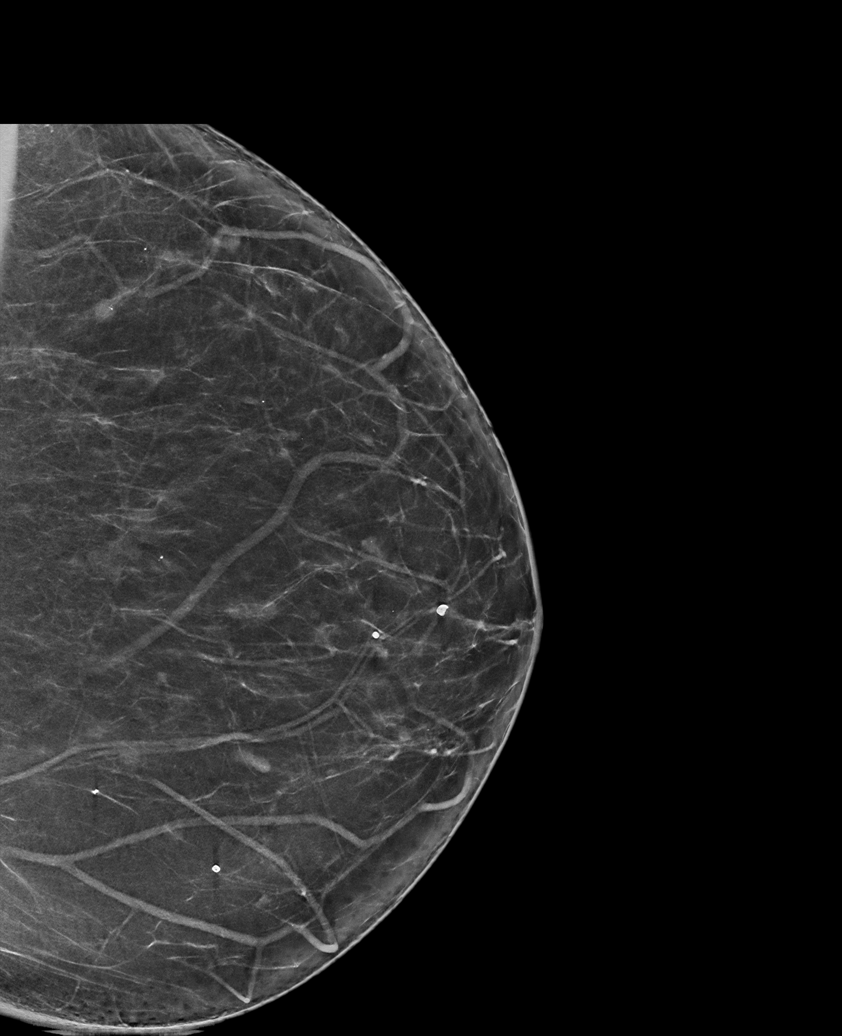

[R CC synth-2D]
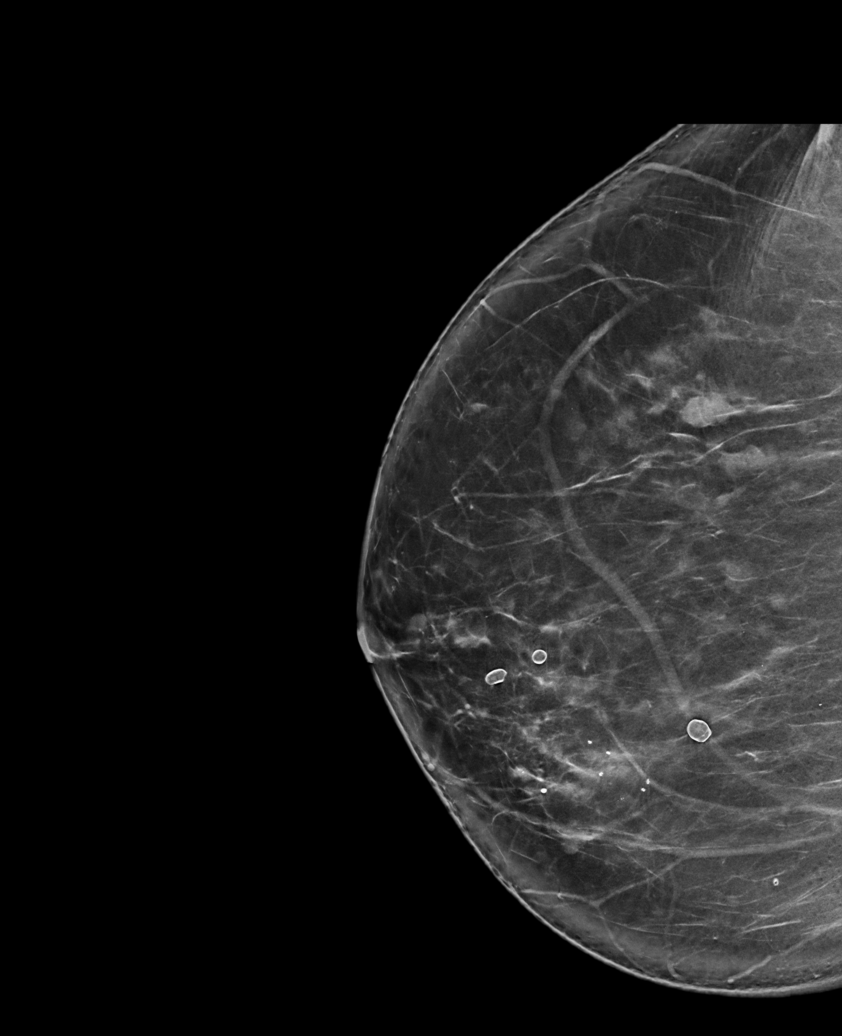

[L CC synth-2D (2 of 2)]
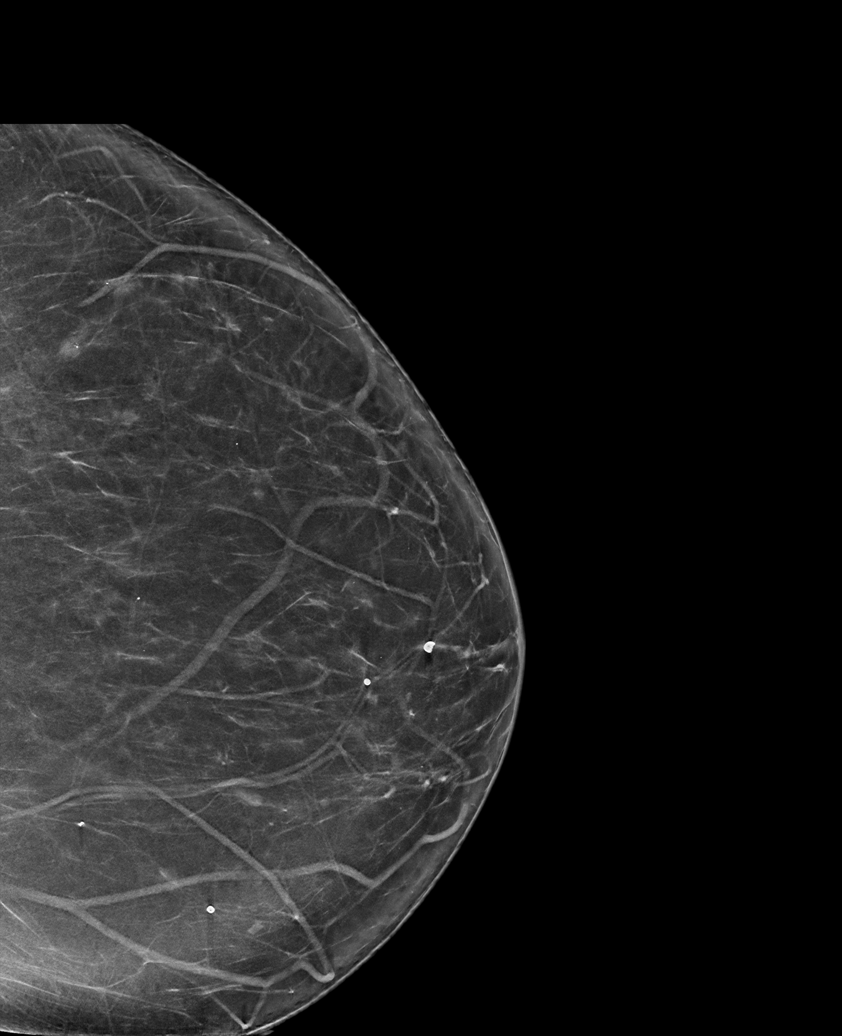

[L MLO synth-2D]
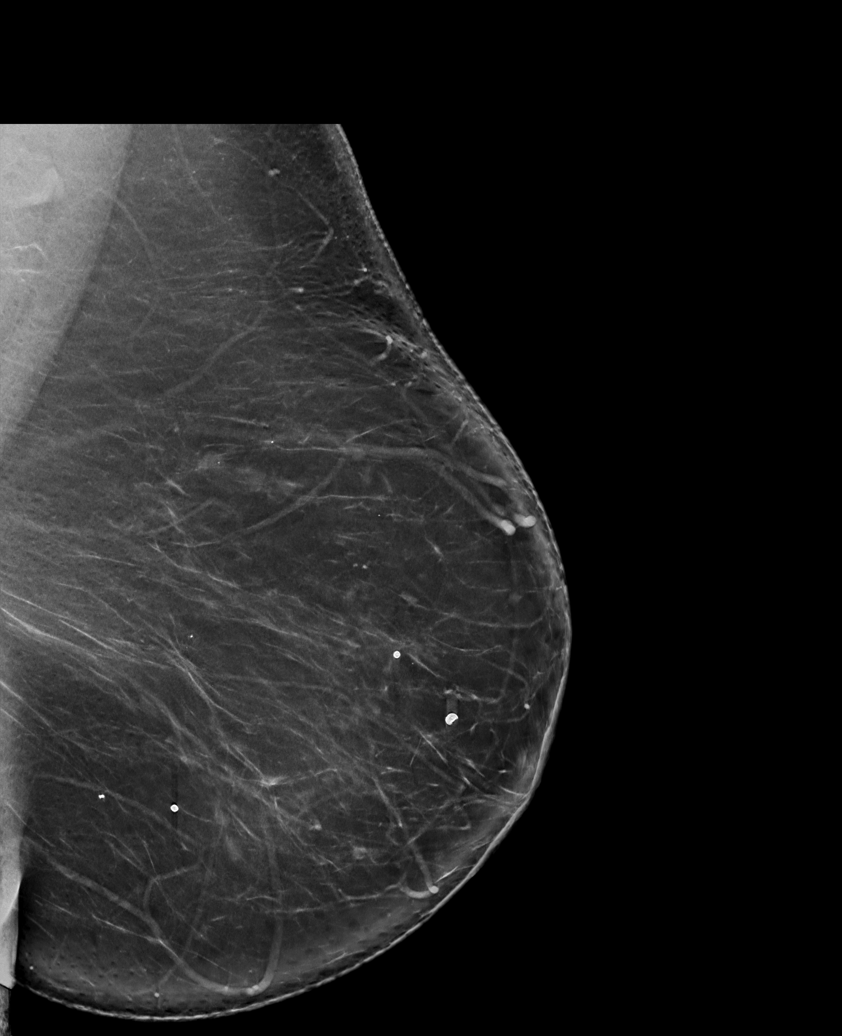

[L CC tomo · tomo slice 41/81.0]
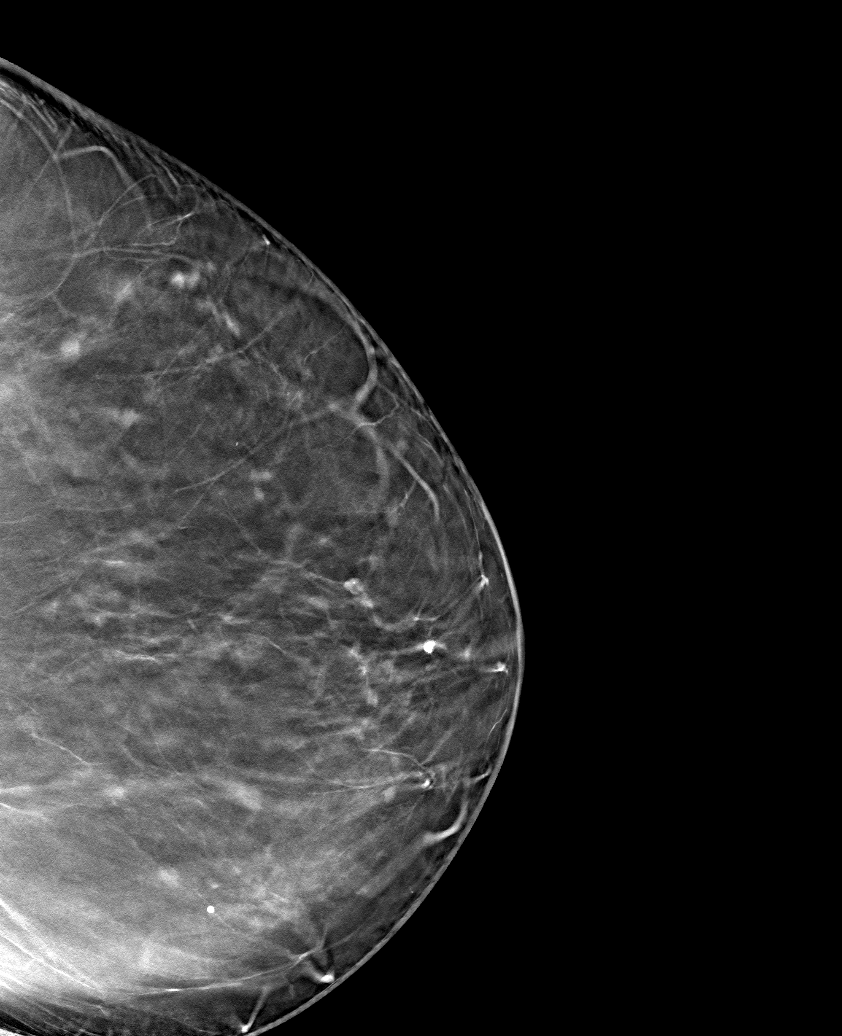

[6 of 30 positions shown; findings below may reference images not displayed]

ACR Breast Density Category b: There are scattered areas of
fibroglandular density.
FINDINGS: There are no findings suspicious for malignancy. Images were
processed with CAD.
IMPRESSION: No mammographic evidence of malignancy. A result letter of this
screening mammogram will be mailed directly to the patient.

RECOMMENDATION:
Screening mammogram in one year. (Code:[id number])

BI-RADS CATEGORY  1: Negative.

## 2021-07-09 ENCOUNTER — Ambulatory Visit
Admission: RE | Admit: 2021-07-09 | Discharge: 2021-07-09 | Disposition: A | Discharge status: Home / Self Care | Payer: PPO | Source: Ambulatory Visit | Attending: Physician Assistant | Admitting: Physician Assistant

## 2021-07-09 DIAGNOSIS — M8589 Other specified disorders of bone density and structure, multiple sites: Secondary | ICD-10-CM

## 2021-07-09 DIAGNOSIS — Z780 Asymptomatic menopausal state: Secondary | ICD-10-CM | POA: Diagnosis not present

## 2021-07-23 DIAGNOSIS — R748 Abnormal levels of other serum enzymes: Secondary | ICD-10-CM | POA: Diagnosis not present

## 2021-08-14 DIAGNOSIS — Z0000 Encounter for general adult medical examination without abnormal findings: Secondary | ICD-10-CM | POA: Diagnosis not present

## 2021-08-14 DIAGNOSIS — Z139 Encounter for screening, unspecified: Secondary | ICD-10-CM | POA: Diagnosis not present

## 2021-08-14 DIAGNOSIS — Z6841 Body Mass Index (BMI) 40.0 and over, adult: Secondary | ICD-10-CM | POA: Diagnosis not present

## 2021-08-14 DIAGNOSIS — E669 Obesity, unspecified: Secondary | ICD-10-CM | POA: Diagnosis not present

## 2021-08-14 DIAGNOSIS — Z1331 Encounter for screening for depression: Secondary | ICD-10-CM | POA: Diagnosis not present

## 2021-08-14 DIAGNOSIS — Z9181 History of falling: Secondary | ICD-10-CM | POA: Diagnosis not present

## 2021-08-14 DIAGNOSIS — E785 Hyperlipidemia, unspecified: Secondary | ICD-10-CM | POA: Diagnosis not present

## 2021-09-04 ENCOUNTER — Other Ambulatory Visit: Payer: Self-pay

## 2021-09-04 ENCOUNTER — Ambulatory Visit (INDEPENDENT_AMBULATORY_CARE_PROVIDER_SITE_OTHER): Payer: PPO | Admitting: Cardiology

## 2021-09-04 ENCOUNTER — Encounter: Payer: Self-pay | Admitting: Cardiology

## 2021-09-04 VITALS — BP 117/69 | HR 71 | Ht 63.5 in | Wt 243.8 lb

## 2021-09-04 DIAGNOSIS — M79604 Pain in right leg: Secondary | ICD-10-CM | POA: Diagnosis not present

## 2021-09-04 DIAGNOSIS — E119 Type 2 diabetes mellitus without complications: Secondary | ICD-10-CM

## 2021-09-04 DIAGNOSIS — I493 Ventricular premature depolarization: Secondary | ICD-10-CM | POA: Diagnosis not present

## 2021-09-04 DIAGNOSIS — M79605 Pain in left leg: Secondary | ICD-10-CM
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
## 2021-09-04 NOTE — Patient Instructions (Addendum)
Medication Instructions:  ?Your physician recommends that you continue on your current medications as directed. Please refer to the Current Medication list given to you today.  ?*If you need a refill on your cardiac medications before your next appointment, please call your pharmacy* ? ? ?Lab Work: ?None ?If you have labs (blood work) drawn today and your tests are completely normal, you will receive your results only by: ?MyChart Message (if you have MyChart) OR ?A paper copy in the mail ?If you have any lab test that is abnormal or we need to change your treatment, we will call you to review the results. ? ? ?Testing/Procedures: ?Your physician has requested that you have an ankle brachial index (ABI). During this test an ultrasound and blood pressure cuff are used to evaluate the arteries that supply the arms and legs with blood. Allow thirty minutes for this exam. There are no restrictions or special instructions. ? ? ? ?Follow-Up: ?At [facility], you and your health needs are our priority.  As part of our continuing mission to provide you with exceptional heart care, we have created designated Provider Care Teams.  These Care Teams include your primary Cardiologist (physician) and Advanced Practice Providers (APPs -  Physician Assistants and Nurse Practitioners) who all work together to provide you with the care you need, when you need it. ? ?We recommend signing up for the patient portal called "MyChart".  Sign up information is provided on this After Visit Summary.  MyChart is used to connect with patients for Virtual Visits (Telemedicine).  Patients are able to view lab/test results, encounter notes, upcoming appointments, etc.  Non-urgent messages can be sent to your provider as well.   ?To learn more about what you can do with MyChart, go to [email or web address].   ? ?Your next appointment:   ?1 year(s) ? ?The format for your next appointment:   ?In Person ? ?Provider:   ?Berniece Salines, DO    ? ? ?Other Instructions ? Ankle-Brachial Index Test ?Why am I having this test? ?The ankle-brachial index (ABI) test is used to diagnose peripheral vascular disease (PVD). PVD is also known as peripheral arterial disease (PAD). PVD is the blocking or hardening of the arteries anywhere within the circulatory system beyond the heart. It is a form of cardiovascular disease. PVD is caused by: ?Cholesterol deposits in your blood vessels (atherosclerosis). This is the most common cause of this condition. ?Inflammation in the blood vessels. ?Blood clots in the vessels. ?Cholesterol deposits cause arteries to narrow. Decreased delivery of oxygen to your tissues causes muscle pain, cramping, and fatigue that occurs during exercise and is relieved by rest. This is called intermittent claudication. ?PVD means that there may also be buildup of cholesterol: ?In your heart. This raises the risk of heart attacks. ?In your brain. This raises the risk of strokes. ?What is being tested? ?The ankle-brachial index test measures the blood flow in your arms and legs. The blood flow will show if blood vessels in your legs have been narrowed by cholesterol deposits. ?How do I prepare for this test? ?Wear loose, comfortable clothing with shoes that are easy to remove. ?Do not use any products that contain nicotine or tobacco for at least 30 minutes before the test. These products include cigarettes, chewing tobacco, and vaping devices, such as e-cigarettes. ?Avoid caffeine, alcohol, and heavy activity an hour before the test. ?What happens during the test? ? ?You will lie down in a resting position with your legs and arms at  heart level. ?Your health care provider will use a blood pressure machine and a small ultrasound device (Doppler) to measure the systolic pressures on your upper arms and ankles. Systolic pressure is the pressure inside your arteries when your heart pumps. ?Systolic pressures will be taken several times, and at several  points, on both the ankle and the arm. Your health care provider may also measure your toe. ?Your health care provider will divide the highest systolic pressure of the ankle by the highest systolic pressure of the arm. The result is the ankle-brachial pressure ratio, or ABI. ?Sometimes this test will be repeated after you have exercised on a treadmill for 5 minutes. You may have leg pain during the exercise portion of the test if you suffer from PVD. If the index number drops after exercise, this may show that PVD is present. ?How are the results reported? ?Your test results will be reported as a value that shows the ratio of your ankle pressure to your arm pressure (ABI ratio). Your health care provider will compare your results to normal ranges that were established after testing a large group of people (reference ranges). These ranges may vary among labs and hospitals. ?For this test, an abnormal reading is typically considered, which is an ABI ratio of 0.9 or less. ?What do the results mean? ?An ABI ratio that is below the reference range is considered abnormal and may indicate PVD in the legs. ?Talk with your health care provider about what your results mean. ?Questions to ask your health care provider ?Ask your health care provider, or the department that is doing the test: ?When will my results be ready? ?How will I get my results? ?What are my treatment options? ?What other tests do I need? ?What are my next steps? ?Summary ?The ankle-brachial index (ABI) test is used to diagnose peripheral vascular disease (PVD). PVD is also known as peripheral arterial disease (PAD). ?The ABI test measures the blood flow in your arms and legs. ?The highest systolic pressure of the ankle is divided by the highest systolic pressure of the arm. The result is the ABI ratio. ?An ABI ratio of 0.9 or less is considered abnormal and may indicate PVD in the legs. ?This information is not intended to replace advice given to you by  your health care provider. Make sure you discuss any questions you have with your health care provider. ?Document Revised: 12/12/2019 Document Reviewed: 12/12/2019 ?Elsevier Patient Education ? [place]. ? ?

## 2021-09-16 ENCOUNTER — Other Ambulatory Visit: Payer: Self-pay | Admitting: Cardiology

## 2021-09-16 DIAGNOSIS — M79604 Pain in right leg: Secondary | ICD-10-CM

## 2021-09-19 DIAGNOSIS — Z6841 Body Mass Index (BMI) 40.0 and over, adult: Secondary | ICD-10-CM | POA: Diagnosis not present

## 2021-09-19 DIAGNOSIS — E785 Hyperlipidemia, unspecified: Secondary | ICD-10-CM | POA: Diagnosis not present

## 2021-09-19 DIAGNOSIS — M1990 Unspecified osteoarthritis, unspecified site: Secondary | ICD-10-CM | POA: Diagnosis not present

## 2021-09-19 DIAGNOSIS — I10 Essential (primary) hypertension: Secondary | ICD-10-CM | POA: Diagnosis not present

## 2021-09-19 DIAGNOSIS — E119 Type 2 diabetes mellitus without complications: Secondary | ICD-10-CM | POA: Diagnosis not present

## 2021-09-19 LAB — COMPREHENSIVE METABOLIC PANEL
Albumin: 4.2 (ref 3.5–5.0)
Calcium: 9.7 (ref 8.7–10.7)
Globulin: 3.3
eGFR: 89

## 2021-09-19 LAB — BASIC METABOLIC PANEL
BUN: 15 (ref 4–21)
CO2: 25 — AB (ref 13–22)
Chloride: 99 (ref 99–108)
Creatinine: 0.7 (ref 0.5–1.1)
Glucose: 129
Potassium: 4.5 mEq/L (ref 3.5–5.1)
Sodium: 136 — AB (ref 137–147)

## 2021-09-19 LAB — HEPATIC FUNCTION PANEL
ALT: 56 U/L — AB (ref 7–35)
AST: 80 — AB (ref 13–35)
Alkaline Phosphatase: 165 — AB (ref 25–125)
Bilirubin, Total: 0.4

## 2021-09-19 LAB — LIPID PANEL
Cholesterol: 166 (ref 0–200)
HDL: 76 — AB (ref 35–70)
LDL Cholesterol: 76
LDl/HDL Ratio: 1
Triglycerides: 73 (ref 40–160)

## 2021-09-19 LAB — HEMOGLOBIN A1C: Hemoglobin A1C: 7.3

## 2021-09-24 DIAGNOSIS — M25562 Pain in left knee: Secondary | ICD-10-CM | POA: Diagnosis not present

## 2021-09-24 DIAGNOSIS — M1711 Unilateral primary osteoarthritis, right knee: Secondary | ICD-10-CM | POA: Diagnosis not present

## 2021-09-24 DIAGNOSIS — M7061 Trochanteric bursitis, right hip: Secondary | ICD-10-CM | POA: Diagnosis not present

## 2021-09-24 DIAGNOSIS — M1611 Unilateral primary osteoarthritis, right hip: Secondary | ICD-10-CM | POA: Diagnosis not present

## 2021-10-07 ENCOUNTER — Other Ambulatory Visit: Payer: Self-pay | Admitting: Cardiology

## 2021-10-07 ENCOUNTER — Ambulatory Visit (HOSPITAL_COMMUNITY)
Admission: RE | Admit: 2021-10-07 | Discharge: 2021-10-07 | Disposition: A | Discharge status: Home / Self Care | Payer: PPO | Source: Ambulatory Visit | Attending: Cardiology | Admitting: Cardiology

## 2021-10-07 DIAGNOSIS — M79604 Pain in right leg: Secondary | ICD-10-CM

## 2021-10-07 DIAGNOSIS — E119 Type 2 diabetes mellitus without complications: Secondary | ICD-10-CM | POA: Insufficient documentation

## 2021-10-07 DIAGNOSIS — M79605 Pain in left leg: Secondary | ICD-10-CM | POA: Diagnosis not present

## 2021-10-08 DIAGNOSIS — Z0289 Encounter for other administrative examinations: Secondary | ICD-10-CM

## 2021-10-15 ENCOUNTER — Encounter (INDEPENDENT_AMBULATORY_CARE_PROVIDER_SITE_OTHER): Payer: Self-pay | Admitting: Family Medicine

## 2021-10-15 ENCOUNTER — Ambulatory Visit (INDEPENDENT_AMBULATORY_CARE_PROVIDER_SITE_OTHER): Payer: PPO | Admitting: Family Medicine

## 2021-10-15 VITALS — BP 133/78 | HR 69 | Temp 98.2°F | Ht 64.0 in | Wt 241.0 lb

## 2021-10-15 DIAGNOSIS — R5383 Other fatigue: Secondary | ICD-10-CM

## 2021-10-15 DIAGNOSIS — E669 Obesity, unspecified: Secondary | ICD-10-CM

## 2021-10-15 DIAGNOSIS — K760 Fatty (change of) liver, not elsewhere classified: Secondary | ICD-10-CM

## 2021-10-15 DIAGNOSIS — Z6841 Body Mass Index (BMI) 40.0 and over, adult: Secondary | ICD-10-CM

## 2021-10-15 DIAGNOSIS — R0602 Shortness of breath: Secondary | ICD-10-CM | POA: Diagnosis not present

## 2021-10-15 DIAGNOSIS — G4733 Obstructive sleep apnea (adult) (pediatric): Secondary | ICD-10-CM

## 2021-10-15 DIAGNOSIS — G4700 Insomnia, unspecified: Secondary | ICD-10-CM | POA: Insufficient documentation

## 2021-10-15 DIAGNOSIS — E1159 Type 2 diabetes mellitus with other circulatory complications: Secondary | ICD-10-CM

## 2021-10-15 DIAGNOSIS — E1165 Type 2 diabetes mellitus with hyperglycemia: Secondary | ICD-10-CM

## 2021-10-15 DIAGNOSIS — I152 Hypertension secondary to endocrine disorders: Secondary | ICD-10-CM | POA: Diagnosis not present

## 2021-10-15 DIAGNOSIS — Z1331 Encounter for screening for depression: Secondary | ICD-10-CM | POA: Diagnosis not present

## 2021-10-15 DIAGNOSIS — E162 Hypoglycemia, unspecified: Secondary | ICD-10-CM | POA: Insufficient documentation

## 2021-10-15 DIAGNOSIS — Z7984 Long term (current) use of oral hypoglycemic drugs: Secondary | ICD-10-CM

## 2021-10-15 DIAGNOSIS — E66813 Obesity, class 3: Secondary | ICD-10-CM

## 2021-10-15 DIAGNOSIS — E559 Vitamin D deficiency, unspecified: Secondary | ICD-10-CM | POA: Diagnosis not present

## 2021-10-16 LAB — VITAMIN D 25 HYDROXY (VIT D DEFICIENCY, FRACTURES): Vit D, 25-Hydroxy: 56.5 ng/mL (ref 30.0–100.0)

## 2021-10-16 LAB — TSH: TSH: 1.28 u[IU]/mL (ref 0.450–4.500)

## 2021-10-16 LAB — VITAMIN B12: Vitamin B-12: 666 pg/mL (ref 232–1245)

## 2021-10-16 LAB — INSULIN, RANDOM: INSULIN: 37.8 u[IU]/mL — ABNORMAL HIGH (ref 2.6–24.9)

## 2021-10-22 ENCOUNTER — Other Ambulatory Visit (INDEPENDENT_AMBULATORY_CARE_PROVIDER_SITE_OTHER): Payer: Self-pay

## 2021-10-22 LAB — PROTEIN, TOTAL: Total Protein: 7.5 (ref 6.4–8.2)

## 2021-10-24 DIAGNOSIS — M1712 Unilateral primary osteoarthritis, left knee: Secondary | ICD-10-CM | POA: Diagnosis not present

## 2021-10-24 DIAGNOSIS — M25551 Pain in right hip: Secondary | ICD-10-CM | POA: Diagnosis not present

## 2021-10-24 DIAGNOSIS — M1711 Unilateral primary osteoarthritis, right knee: Secondary | ICD-10-CM | POA: Diagnosis not present

## 2021-10-29 ENCOUNTER — Ambulatory Visit (INDEPENDENT_AMBULATORY_CARE_PROVIDER_SITE_OTHER): Payer: PPO | Admitting: Family Medicine

## 2021-10-29 ENCOUNTER — Encounter (INDEPENDENT_AMBULATORY_CARE_PROVIDER_SITE_OTHER): Payer: Self-pay | Admitting: Family Medicine

## 2021-10-29 VITALS — BP 119/76 | HR 85 | Temp 98.5°F | Ht 64.0 in | Wt 237.0 lb

## 2021-10-29 DIAGNOSIS — E669 Obesity, unspecified: Secondary | ICD-10-CM | POA: Diagnosis not present

## 2021-10-29 DIAGNOSIS — E1159 Type 2 diabetes mellitus with other circulatory complications: Secondary | ICD-10-CM

## 2021-10-29 DIAGNOSIS — I152 Hypertension secondary to endocrine disorders: Secondary | ICD-10-CM | POA: Diagnosis not present

## 2021-10-29 DIAGNOSIS — Z7984 Long term (current) use of oral hypoglycemic drugs: Secondary | ICD-10-CM

## 2021-10-29 DIAGNOSIS — E1169 Type 2 diabetes mellitus with other specified complication: Secondary | ICD-10-CM | POA: Diagnosis not present

## 2021-10-29 DIAGNOSIS — Z6841 Body Mass Index (BMI) 40.0 and over, adult: Secondary | ICD-10-CM

## 2021-10-29 DIAGNOSIS — K760 Fatty (change of) liver, not elsewhere classified: Secondary | ICD-10-CM

## 2021-10-29 DIAGNOSIS — Z9189 Other specified personal risk factors, not elsewhere classified: Secondary | ICD-10-CM

## 2021-10-29 DIAGNOSIS — E559 Vitamin D deficiency, unspecified: Secondary | ICD-10-CM

## 2021-11-05 DIAGNOSIS — G4733 Obstructive sleep apnea (adult) (pediatric): Secondary | ICD-10-CM | POA: Diagnosis not present

## 2021-11-06 NOTE — Progress Notes (Signed)
Chief Complaint:   Sydney Leach (MR# [id number]) is a 66 y.o. female who presents for evaluation and treatment of obesity and related comorbidities. Current BMI is Body mass index is 41.37 kg/m. Sydney Leach has been struggling with her weight for many years and has been unsuccessful in either losing weight, maintaining weight loss, or reaching her healthy weight goal.  Sydney Leach lives with her husband 18 years old and 45 year old son. She was here in 2020 with Dr. Jearld Shines, but the diet was too strict and she is not able to follow it. Patient's PCP is at [facility] in [place], [place] and per patient last seen in March 30th had all the blood work done then.   Sydney Leach is currently in the action stage of change and ready to dedicate time achieving and maintaining a healthier weight. Sydney Leach is interested in becoming our patient and working on intensive lifestyle modifications including (but not limited to) diet and exercise for weight loss.  Sydney Leach's habits were reviewed today and are as follows: Her family eats meals together, she struggles with family and or coworkers weight loss sabotage, her desired weight loss is 61 lbs, she started gaining weight in 2002-2007, her heaviest weight ever was 261 pounds, she has significant food cravings issues, she frequently makes poor food choices, she has problems with excessive hunger, and she struggles with emotional eating.  Depression Screen Sydney Leach's Food and Mood (modified PHQ-9) score was 17.     10/15/2021    8:51 AM  Depression screen PHQ 2/9  Decreased Interest 2  Down, Depressed, Hopeless 3  PHQ - 2 Score 5  Altered sleeping 3  Tired, decreased energy 3  Change in appetite 2  Feeling bad or failure about yourself  1  Trouble concentrating 2  Moving slowly or fidgety/restless 1  Suicidal thoughts 0  PHQ-9 Score 17  Difficult doing work/chores Not difficult at all   Subjective:   1. Other fatigue Sydney Leach admits to  daytime somnolence and admits to waking up still tired. Patient has a history of symptoms of daytime fatigue, morning fatigue, and morning headache. Sydney Leach generally gets 2 or 4 hours of sleep per night, and states that she has nightime awakenings. Snoring is present. Apneic episodes are present. Epworth Sleepiness Score is 16. Patient feels OSA is very well controlled. She is with chronic fatigue syndrome, and very physically deconditioned at current state.   2. SOB (shortness of breath) on exertion Sydney Leach notes increasing shortness of breath with exercising and seems to be worsening over time with weight gain. She notes getting out of breath sooner with activity than she used to. This has not gotten worse recently. Kymia denies shortness of breath at rest or orthopnea.  3. Type 2 diabetes mellitus with hyperglycemia, without long-term current use of insulin (HCC) Sydney Leach is taking Janumet. Managed per PCP and she is awaiting Biochemist, clinical for [employer]. Patient failed Trulicity and Ozempic due to nausea/vomiting and couldn't eat.   4. Hypertension associated with type 2 diabetes mellitus ([place]) Sydney Leach is taking lisinopril. Cardiovascular ROS: no chest pain or dyspnea on exertion.  5. Fatty liver First diagnosed 1 year ago, and was told she needs to eat healthy and lose weight.   6. Vitamin D deficiency Takes multivitamin daily. Bone density in January 2023 done and told she has a history of osteopenia per patient.   7. OSA on CPAP OSA mask for 1 year now, her symptoms are much better and  the patient states she never goes a night without it.   Assessment/Plan:   1. Other fatigue Sydney Leach does feel that her weight is causing her energy to be lower than it should be. Fatigue Sydney Leach be related to obesity, depression or many other causes. Labs will be ordered, and in the meanwhile, Shivon will focus on self care including making healthy food choices, increasing physical activity and focusing  on stress reduction.  - Vitamin B12  2. SOB (shortness of breath) on exertion Sydney Leach does feel that she gets out of breath more easily that she used to when she exercises. Sydney Leach's shortness of breath appears to be obesity related and exercise induced. She has agreed to work on weight loss and gradually increase exercise to treat her exercise induced shortness of breath. Will continue to monitor closely.  3. Type 2 diabetes mellitus with hyperglycemia, without long-term current use of insulin (HCC) Continue medications per PCP. Importance of prudent nutritional plan and weight loss was discussed with the patient. Good blood sugar control is important to decrease the likelihood of diabetic complications such as nephropathy, neuropathy, limb loss, blindness, coronary artery disease, and death. Intensive lifestyle modification including diet, exercise and weight loss are the first line of treatment for diabetes.   - Vitamin B12 - Insulin, random - TSH  4. Hypertension associated with type 2 diabetes mellitus ([place]) Blood pressure is at goal. Continue medications per PCP. Sydney Leach will continue working on healthy weight loss and exercise to improve blood pressure control. We will watch for signs of hypotension as she continues her lifestyle modifications.  5. Fatty liver CMP done recently at PCP's office. LPN asked for labs to be sent to [place]. Weight loss via prudent nutritional plan.   6. Vitamin D deficiency We will check Vitamin D level today. Start weight bearing activity. Continue Vitamin D supplement per multivitamin.   - VITAMIN D 25 Hydroxy (Vit-D Deficiency, Fractures)  7. OSA on CPAP Intensive lifestyle modifications are the first line treatment for this issue. We discussed several lifestyle modifications today and she will continue to work on diet, exercise and weight loss efforts. We will continue to monitor. Orders and follow up as documented in patient record.   8. Depression  screening Sydney Leach had a positive depression screening. Depression is commonly associated with obesity and often results in emotional eating behaviors. We will monitor this closely and work on CBT to help improve the non-hunger eating patterns. Referral to Psychology Sydney Leach be required if no improvement is seen as she continues in our clinic.  9. Obesity, current BMI 41.5 Sydney Leach is currently in the action stage of change and her goal is to continue with weight loss efforts. I recommend Sydney Leach begin the structured treatment plan as follows:  She has agreed to the Category 2 Plan.  Exercise goals: As is. Patient is unable to stand more than a minute due to fibromyalgia and chronic joint pain.  Behavioral modification strategies: increasing lean protein intake, decreasing simple carbohydrates, decreasing liquid calories, and keeping healthy foods in the home.  She was informed of the importance of frequent follow-up visits to maximize her success with intensive lifestyle modifications for her multiple health conditions. She was informed we would discuss her lab results at her next visit unless there is a critical issue that needs to be addressed sooner. Sydney Leach agreed to keep her next visit at the agreed upon time to discuss these results.  Objective:   Blood pressure 133/78, pulse 69, temperature 98.2 F (  36.8 C), height '5\' 4"'$  (1.626 m), weight 241 lb (109.3 kg), SpO2 98 %. Body mass index is 41.37 kg/m.  EKG: Normal sinus rhythm, rate 71 BPM.  Indirect Calorimeter completed today shows a VO2 of 271 and a REE of 1872.  Her calculated basal metabolic rate is 1761 thus her basal metabolic rate is better than expected.  General: Cooperative, alert, well developed, in no acute distress. HEENT: Conjunctivae and lids unremarkable. Cardiovascular: Regular rhythm.  Lungs: Normal work of breathing. Neurologic: No focal deficits.   Lab Results  Component Value Date   CREATININE 0.7 09/19/2021    BUN 15 09/19/2021   NA 136 (A) 09/19/2021   K 4.5 09/19/2021   CL 99 09/19/2021   CO2 25 (A) 09/19/2021   Lab Results  Component Value Date   ALT 56 (A) 09/19/2021   AST 80 (A) 09/19/2021   ALKPHOS 165 (A) 09/19/2021   BILITOT 0.8 05/24/2019   Lab Results  Component Value Date   HGBA1C 7.3 09/19/2021   HGBA1C 6.1 (H) 05/24/2019   Lab Results  Component Value Date   INSULIN 37.8 (H) 10/15/2021   INSULIN 21.3 01/12/2019   Lab Results  Component Value Date   TSH 1.280 10/15/2021   Lab Results  Component Value Date   CHOL 166 09/19/2021   HDL 76 (A) 09/19/2021   LDLCALC 76 09/19/2021   TRIG 73 09/19/2021   Lab Results  Component Value Date   WBC 12.4 (H) 05/29/2019   HGB 9.3 (L) 05/29/2019   HCT 28.9 (L) 05/29/2019   MCV 93.2 05/29/2019   PLT 187 05/29/2019   No results found for: IRON, TIBC, FERRITIN Obesity Behavioral Intervention:   Approximately 15 minutes were spent on the discussion below.  ASK: We discussed the diagnosis of obesity with Sydney Leach today and Sydney Leach agreed to give [place] permission to discuss obesity behavioral modification therapy today.  ASSESS: Sydney Leach has the diagnosis of obesity and her BMI today is 41.5. Sydney Leach is in the action stage of change.   ADVISE: Sydney Leach was educated on the multiple health risks of obesity as well as the benefit of weight loss to improve her health. She was advised of the need for long term treatment and the importance of lifestyle modifications to improve her current health and to decrease her risk of future health problems.  AGREE: Multiple dietary modification options and treatment options were discussed and Sydney Leach agreed to follow the recommendations documented in the above note.  ARRANGE: Sydney Leach was educated on the importance of frequent visits to treat obesity as outlined per CMS and USPSTF guidelines and agreed to schedule her next follow up appointment today.  Attestation Statements:   Reviewed by  clinician on day of visit: allergies, medications, problem list, medical history, surgical history, family history, social history, and previous encounter notes.   Wilhemena Durie, am acting as transcriptionist for [employer], DO.  I have reviewed the above documentation for accuracy and completeness, and I agree with the above. Marjory Sneddon, D.O.  The [place] was signed into law in 2016 which includes the topic of electronic health records.  This provides immediate access to information in MyChart.  This includes consultation notes, operative notes, office notes, lab results and pathology reports.  If you have any questions about what you read please let us know at your next visit so we can discuss your concerns and take corrective action if need be.  We are right here with  you.

## 2021-11-10 NOTE — Progress Notes (Signed)
Chief Complaint:   OBESITY Sydney Leach is here to discuss her progress with her obesity treatment plan along with follow-up of her obesity related diagnoses. Sydney Leach is on the Category 2 Plan and states she is following her eating plan approximately 80% of the time. Sydney Leach states she is walking 10-20 minutes 7 times per week.  Today's visit was #: 2 Starting weight: 241 lbs Starting date: 10/15/2021 Today's weight: 237 lbs Today's date: 10/29/2021 Total lbs lost to date: 4 lbs Total lbs lost since last in-office visit: 4  Interim History: Sydney Leach is here today for her first follow-up office visit since starting the program with [place].  All blood work/ lab tests that were recently ordered by myself or an outside provider were reviewed with patient today per their request.   Extended time was spent counseling her on all new disease processes that were discovered or preexisting ones that are affected by BMI.  she understands that many of these abnormalities will need to monitored regularly along with the current treatment plan of prudent dietary changes, in which we are making each and every office visit, to improve these health parameters. Sydney Leach finds it difficult to eat all foods she is suppose to and to avoid unhealthy ones due to her "350 lb husband who eats horribly and I have to fix it for him or he'd starve." She says it is difficult to cook for him and herself separately. Pt denies hunger or cravings.  Subjective:   1. Type 2 diabetes mellitus with other specified complication, without long-term current use of insulin ([place]) Discussed labs with patient today. Pt denies issues with low or high BS. Diabetes Mellitus: Not at goal. Medication: Janumet an dis prescribed Mounjaro but has not started it due to medication still being on backorder. Issues reviewed: blood sugar goals, complications of diabetes mellitus, hypoglycemia prevention and treatment, exercise, and nutrition.    Lab Results  Component Value Date   HGBA1C 7.3 09/19/2021   HGBA1C 6.1 (H) 05/24/2019   Lab Results  Component Value Date   LDLCALC 76 09/19/2021   CREATININE 0.7 09/19/2021   2. Hypertension associated with type 2 diabetes mellitus ([place]) Discussed labs with patient today. Pt is asymptomatic. She denies headaches, vision changes, dizziness, or heart palpitations. Medications: lisinopril, Demadex  3. NAFLD (nonalcoholic fatty liver disease) Worsening. Discussed labs with patient today. Pt has long history of elevated liver enzymes. Pt denies ETOH intake.   4. Vitamin D deficiency Discussed labs with patient today. Pt is on OTC Calcium/Vit D supplement currently.  5. At risk for malnutrition Sydney Leach is at increased risk for malnutrition due to current eating habits.  Assessment/Plan:  No orders of the defined types were placed in this encounter.   Medications Discontinued During This Encounter  Medication Reason   Calcium Polycarbophil (EQUALACTIN PO)    tiZANidine (ZANAFLEX) 2 MG tablet    glucose blood (FREESTYLE TEST STRIPS) test strip    glucose monitoring kit (FREESTYLE) monitoring kit      No orders of the defined types were placed in this encounter.    1. Type 2 diabetes mellitus with other specified complication, without long-term current use of insulin (HCC) Good blood sugar control is important to decrease the likelihood of diabetic complications such as nephropathy, neuropathy, limb loss, blindness, coronary artery disease, and death. Intensive lifestyle modification including diet, exercise and weight loss are the first line of treatment for diabetes. The importance of regular follow up with PCP  and all other specialists as scheduled was stressed to patient today. Focus on prudent nutritional plan and weight loss.  2. Hypertension associated with type 2 diabetes mellitus ([place]) Sydney Leach is working on healthy weight loss and exercise to improve blood pressure  control. We will watch for signs of hypotension as she continues her lifestyle modifications.  CMP stable. Serum creatinine 0.7. BP at goal. Continue medication per PCP. Decrease salt intake by following prudent nutritional plan.  3. NAFLD (nonalcoholic fatty liver disease) We discussed the likely diagnosis of non-alcoholic fatty liver disease today and how this condition is obesity related. Sydney Leach was educated the importance of weight loss. Sydney Leach agreed to continue with her weight loss efforts with healthier diet and exercise as an essential part of her treatment plan.  Labs revealed elevated ALT and AST from prior. ALT is now 56 and AST is 80 respectively.  4. Vitamin D deficiency At goal. Continue OTC supplement.   5. At risk for malnutrition Sydney Leach was given extensive malnutrition prevention education and counseling today of more than 24 minutes.  Counseled her that malnutrition refers to inappropriate nutrients or not the right balance of nutrients for optimal health.  Discussed with Sydney Leach that it is absolutely possible to be malnourished but yet obese.  Risk factors, including but not limited to, inappropriate dietary choices, difficulty with obtaining food due to physical or financial limitations, and various physical and mental health conditions were reviewed with Sydney Leach.   6. Obesity, current BMI 40.7  Sydney Leach is currently in the action stage of change. As such, her goal is to continue with weight loss efforts. She has agreed to the Category 2 Plan.   Exercise goals:  As is.  Behavioral modification strategies: increasing lean protein intake, decreasing simple carbohydrates, meal planning and cooking strategies, keeping healthy foods in the home, avoiding temptations, and planning for success.  Shantaya has agreed to follow-up with our clinic in 2-3 weeks. She was informed of the importance of frequent follow-up visits to maximize her success with intensive  lifestyle modifications for her multiple health conditions.   Objective:   Blood pressure 119/76, pulse 85, temperature 98.5 F (36.9 C), height '5\' 4"'  (1.626 m), weight 237 lb (107.5 kg), SpO2 95 %. Body mass index is 40.68 kg/m.  General: Cooperative, alert, well developed, in no acute distress. HEENT: Conjunctivae and lids unremarkable. Cardiovascular: Regular rhythm.  Lungs: Normal work of breathing. Neurologic: No focal deficits.   Lab Results  Component Value Date   CREATININE 0.7 09/19/2021   BUN 15 09/19/2021   NA 136 (A) 09/19/2021   K 4.5 09/19/2021   CL 99 09/19/2021   CO2 25 (A) 09/19/2021   Lab Results  Component Value Date   ALT 56 (A) 09/19/2021   AST 80 (A) 09/19/2021   ALKPHOS 165 (A) 09/19/2021   BILITOT 0.8 05/24/2019   Lab Results  Component Value Date   HGBA1C 7.3 09/19/2021   HGBA1C 6.1 (H) 05/24/2019   Lab Results  Component Value Date   INSULIN 37.8 (H) 10/15/2021   INSULIN 21.3 01/12/2019   Lab Results  Component Value Date   TSH 1.280 10/15/2021   Lab Results  Component Value Date   CHOL 166 09/19/2021   HDL 76 (A) 09/19/2021   LDLCALC 76 09/19/2021   TRIG 73 09/19/2021   Lab Results  Component Value Date   VD25OH 56.5 10/15/2021   VD25OH 68.9 01/12/2019   Lab Results  Component Value Date  WBC 12.4 (H) 05/29/2019   HGB 9.3 (L) 05/29/2019   HCT 28.9 (L) 05/29/2019   MCV 93.2 05/29/2019   PLT 187 05/29/2019    Attestation Statements:   Reviewed by clinician on day of visit: allergies, medications, problem list, medical history, surgical history, family history, social history, and previous encounter notes.  I, Kathlene November, BS, CMA, am acting as transcriptionist for [employer], DO.  I have reviewed the above documentation for accuracy and completeness, and I agree with the above. Marjory Sneddon, D.O.  The [place] was signed into law in 2016 which includes the topic of electronic health  records.  This provides immediate access to information in MyChart.  This includes consultation notes, operative notes, office notes, lab results and pathology reports.  If you have any questions about what you read please let us know at your next visit so we can discuss your concerns and take corrective action if need be.  We are right here with you.

## 2021-11-13 ENCOUNTER — Encounter (INDEPENDENT_AMBULATORY_CARE_PROVIDER_SITE_OTHER): Payer: Self-pay | Admitting: Family Medicine

## 2021-11-13 ENCOUNTER — Ambulatory Visit (INDEPENDENT_AMBULATORY_CARE_PROVIDER_SITE_OTHER): Payer: PPO | Admitting: Family Medicine

## 2021-11-13 VITALS — BP 124/74 | HR 68 | Temp 98.1°F | Ht 64.0 in | Wt 236.0 lb

## 2021-11-13 DIAGNOSIS — E1169 Type 2 diabetes mellitus with other specified complication: Secondary | ICD-10-CM

## 2021-11-13 DIAGNOSIS — Z7984 Long term (current) use of oral hypoglycemic drugs: Secondary | ICD-10-CM

## 2021-11-13 DIAGNOSIS — E669 Obesity, unspecified: Secondary | ICD-10-CM | POA: Diagnosis not present

## 2021-11-13 DIAGNOSIS — Z6841 Body Mass Index (BMI) 40.0 and over, adult: Secondary | ICD-10-CM

## 2021-11-22 NOTE — Progress Notes (Signed)
Chief Complaint:   OBESITY Sydney Leach is here to discuss her progress with her obesity treatment plan along with follow-up of her obesity related diagnoses. Sydney Leach is on the Category 2 Plan and states she is following her eating plan approximately 60% of the time. Sydney Leach states she is walking and doing yard and house work 120 minutes 7 times per week.  Today's visit was #: 3 Starting weight: 241 lbs Starting date: 10/15/2021 Today's weight: 236 lbs Today's date: 11/13/2021 Total lbs lost to date: 5 Total lbs lost since last in-office visit: 1  Interim History: Sydney Leach says her husband brings home bad foods and temptations that are difficult to resist. She was on plan 90% but yesterdays meals were: breakfast = 3 eggs and a piece of toast; Lunch = nothing; dinner = 3 oz of 90% ground beef and 1 cup of string beans and 1/2 corn. Pt says, "I don't understand why I am not losing."  Subjective:   1. Type 2 diabetes mellitus with other specified complication, without long-term current use of insulin ([place]) Sydney Leach' PCP prescribed Mounjaro 2.5 mg and pt was very ill, feeling gaseous, GERD, nausea, and sick she eats. She has already discontinued the medication.  Assessment/Plan:  No orders of the defined types were placed in this encounter.   There are no discontinued medications.   No orders of the defined types were placed in this encounter.    1. Type 2 diabetes mellitus with other specified complication, without long-term current use of insulin (HCC) Good blood sugar control is important to decrease the likelihood of diabetic complications such as nephropathy, neuropathy, limb loss, blindness, coronary artery disease, and death. Intensive lifestyle modification including diet, exercise and weight loss are the first line of treatment for diabetes.  Counseling on with pt on GLP1 use and potential side effects. I recommend she discontinue Mounjaro and notify PCP of intolerance to GLP1's .  Continue Janumet per PCP.  2. Obesity, current BMI 40.5 Sydney Leach is currently in the action stage of change. As such, her goal is to continue with weight loss efforts. She has agreed to the Category 2 Plan but journal all of her intake and bring log to next OV.   Exercise goals:  As is  Behavioral modification strategies: increasing lean protein intake, decreasing simple carbohydrates, and keeping a strict food journal.  Sydney Leach has agreed to follow-up with our clinic in 2-3 weeks. She was informed of the importance of frequent follow-up visits to maximize her success with intensive lifestyle modifications for her multiple health conditions.   Objective:   Blood pressure 124/74, pulse 68, temperature 98.1 F (36.7 C), height '5\' 4"'$  (1.626 m), weight 236 lb (107 kg), SpO2 96 %. Body mass index is 40.51 kg/m.  General: Cooperative, alert, well developed, in no acute distress. HEENT: Conjunctivae and lids unremarkable. Cardiovascular: Regular rhythm.  Lungs: Normal work of breathing. Neurologic: No focal deficits.   Lab Results  Component Value Date   CREATININE 0.7 09/19/2021   BUN 15 09/19/2021   NA 136 (A) 09/19/2021   K 4.5 09/19/2021   CL 99 09/19/2021   CO2 25 (A) 09/19/2021   Lab Results  Component Value Date   ALT 56 (A) 09/19/2021   AST 80 (A) 09/19/2021   ALKPHOS 165 (A) 09/19/2021   BILITOT 0.8 05/24/2019   Lab Results  Component Value Date   HGBA1C 7.3 09/19/2021   HGBA1C 6.1 (H) 05/24/2019   Lab Results  Component Value Date  INSULIN 37.8 (H) 10/15/2021   INSULIN 21.3 01/12/2019   Lab Results  Component Value Date   TSH 1.280 10/15/2021   Lab Results  Component Value Date   CHOL 166 09/19/2021   HDL 76 (A) 09/19/2021   LDLCALC 76 09/19/2021   TRIG 73 09/19/2021   Lab Results  Component Value Date   VD25OH 56.5 10/15/2021   VD25OH 68.9 01/12/2019   Lab Results  Component Value Date   WBC 12.4 (H) 05/29/2019   HGB 9.3 (L) 05/29/2019   HCT  28.9 (L) 05/29/2019   MCV 93.2 05/29/2019   PLT 187 05/29/2019   No results found for: IRON, TIBC, FERRITIN  Obesity Behavioral Intervention:   Approximately 15 minutes were spent on the discussion below.  ASK: We discussed the diagnosis of obesity with Sydney Leach today and Sydney Leach agreed to give [place] permission to discuss obesity behavioral modification therapy today.  ASSESS: Sydney Leach has the diagnosis of obesity and her BMI today is 40.5. Sydney Leach is in the action stage of change.   ADVISE: Sydney Leach was educated on the multiple health risks of obesity as well as the benefit of weight loss to improve her health. She was advised of the need for long term treatment and the importance of lifestyle modifications to improve her current health and to decrease her risk of future health problems.  AGREE: Multiple dietary modification options and treatment options were discussed and Sydney Leach agreed to follow the recommendations documented in the above note.  ARRANGE: Sydney Leach was educated on the importance of frequent visits to treat obesity as outlined per CMS and USPSTF guidelines and agreed to schedule her next follow up appointment today.  Attestation Statements:   Reviewed by clinician on day of visit: allergies, medications, problem list, medical history, surgical history, family history, social history, and previous encounter notes.  I, Kathlene November, BS, CMA, am acting as transcriptionist for [employer], DO.  I have reviewed the above documentation for accuracy and completeness, and I agree with the above. Marjory Sneddon, D.O.  The [place] was signed into law in 2016 which includes the topic of electronic health records.  This provides immediate access to information in MyChart.  This includes consultation notes, operative notes, office notes, lab results and pathology reports.  If you have any questions about what you read please let us know at your next visit so  we can discuss your concerns and take corrective action if need be.  We are right here with you.

## 2021-11-27 ENCOUNTER — Encounter (INDEPENDENT_AMBULATORY_CARE_PROVIDER_SITE_OTHER): Payer: Self-pay | Admitting: Family Medicine

## 2021-11-27 ENCOUNTER — Ambulatory Visit (INDEPENDENT_AMBULATORY_CARE_PROVIDER_SITE_OTHER): Payer: PPO | Admitting: Family Medicine

## 2021-11-27 VITALS — BP 129/75 | HR 57 | Temp 98.8°F | Ht 64.0 in | Wt 237.0 lb

## 2021-11-27 DIAGNOSIS — E1159 Type 2 diabetes mellitus with other circulatory complications: Secondary | ICD-10-CM

## 2021-11-27 DIAGNOSIS — Z6841 Body Mass Index (BMI) 40.0 and over, adult: Secondary | ICD-10-CM | POA: Diagnosis not present

## 2021-11-27 DIAGNOSIS — Z7984 Long term (current) use of oral hypoglycemic drugs: Secondary | ICD-10-CM | POA: Diagnosis not present

## 2021-11-27 DIAGNOSIS — E1169 Type 2 diabetes mellitus with other specified complication: Secondary | ICD-10-CM

## 2021-11-27 DIAGNOSIS — E669 Obesity, unspecified: Secondary | ICD-10-CM

## 2021-11-27 DIAGNOSIS — I152 Hypertension secondary to endocrine disorders: Secondary | ICD-10-CM

## 2021-11-27 DIAGNOSIS — E119 Type 2 diabetes mellitus without complications: Secondary | ICD-10-CM | POA: Insufficient documentation

## 2021-12-02 DIAGNOSIS — R310 Gross hematuria: Secondary | ICD-10-CM | POA: Diagnosis not present

## 2021-12-03 DIAGNOSIS — N201 Calculus of ureter: Secondary | ICD-10-CM | POA: Diagnosis not present

## 2021-12-03 DIAGNOSIS — K7460 Unspecified cirrhosis of liver: Secondary | ICD-10-CM | POA: Diagnosis not present

## 2021-12-03 DIAGNOSIS — R932 Abnormal findings on diagnostic imaging of liver and biliary tract: Secondary | ICD-10-CM | POA: Diagnosis not present

## 2021-12-04 NOTE — Progress Notes (Unsigned)
Chief Complaint:   OBESITY Sydney Leach is here to discuss her progress with her obesity treatment plan along with follow-up of her obesity related diagnoses. Sydney Leach is on the Category 2 Plan but journaling intake and states she is following her eating plan approximately 85% of the time. Sydney Leach states she is walking 30 minutes 5 times per week.  Today's visit was #: 4 Starting weight: 241 lbs Starting date: 10/15/2021 Today's weight: 237 lbs Today's date: 11/27/2021 Total lbs lost to date: 4 Total lbs lost since last in-office visit: +1  Interim History: Sydney Leach is still not eating all of her proteins and foods on plan. She did journal and did well with tracking. She is still low in protein and occasionally over in calories.  Subjective:   1. Type 2 diabetes mellitus with other specified complication, without long-term current use of insulin (HCC) Pt is intolerant to GLP-1's. She has a follow up with her PCP on 12/23/21 to discuss this with them, as well. Pt remains on Janumet.  2. Hypertension associated with type 2 diabetes mellitus (HCC) BP at goal. Cardiovascular ROS: no chest pain or dyspnea on exertion. Medication: Zestril  Assessment/Plan:   1. Type 2 diabetes mellitus with other specified complication, without long-term current use of insulin (HCC) Good blood sugar control is important to decrease the likelihood of diabetic complications such as nephropathy, neuropathy, limb loss, blindness, coronary artery disease, and death. Intensive lifestyle modification including diet, exercise and weight loss are the first line of treatment for diabetes. Pt is to decrease simple carbs, do not skip any foods on plan, and eat all her proteins.  2. Hypertension associated with type 2 diabetes mellitus ([place]) BP at goal. Sydney Leach is working on healthy weight loss and exercise to improve blood pressure control. We will watch for signs of hypotension as she continues her lifestyle modifications.  Continue Zestril per PCP.  3. Obesity, Current BMI 40.7 Sydney Leach is currently in the action stage of change. As such, her goal is to continue with weight loss efforts. She has agreed to change to keeping a food journal and adhering to recommended goals of 1300-1400 calories and 100+ grams protein.   Exercise goals:  As is  Behavioral modification strategies: increasing lean protein intake, planning for success, and keeping a strict food journal.  Sydney Leach has agreed to follow-up with our clinic in 2-3 weeks. She was informed of the importance of frequent follow-up visits to maximize her success with intensive lifestyle modifications for her multiple health conditions.   Objective:   Blood pressure 129/75, pulse (!) 57, temperature 98.8 F (37.1 C), height '5\' 4"'$  (1.626 m), weight 237 lb (107.5 kg), SpO2 98 %. Body mass index is 40.68 kg/m.  General: Cooperative, alert, well developed, in no acute distress. HEENT: Conjunctivae and lids unremarkable. Cardiovascular: Regular rhythm.  Lungs: Normal work of breathing. Neurologic: No focal deficits.   Lab Results  Component Value Date   CREATININE 0.7 09/19/2021   BUN 15 09/19/2021   NA 136 (A) 09/19/2021   K 4.5 09/19/2021   CL 99 09/19/2021   CO2 25 (A) 09/19/2021   Lab Results  Component Value Date   ALT 56 (A) 09/19/2021   AST 80 (A) 09/19/2021   ALKPHOS 165 (A) 09/19/2021   BILITOT 0.8 05/24/2019   Lab Results  Component Value Date   HGBA1C 7.3 09/19/2021   HGBA1C 6.1 (H) 05/24/2019   Lab Results  Component Value Date   INSULIN 37.8 (H) 10/15/2021  INSULIN 21.3 01/12/2019   Lab Results  Component Value Date   TSH 1.280 10/15/2021   Lab Results  Component Value Date   CHOL 166 09/19/2021   HDL 76 (A) 09/19/2021   LDLCALC 76 09/19/2021   TRIG 73 09/19/2021   Lab Results  Component Value Date   VD25OH 56.5 10/15/2021   VD25OH 68.9 01/12/2019   Lab Results  Component Value Date   WBC 12.4 (H) 05/29/2019    HGB 9.3 (L) 05/29/2019   HCT 28.9 (L) 05/29/2019   MCV 93.2 05/29/2019   PLT 187 05/29/2019   No results found for: "IRON", "TIBC", "FERRITIN"  Obesity Behavioral Intervention:   Approximately 15 minutes were spent on the discussion below.  ASK: We discussed the diagnosis of obesity with Sydney Leach today and Sydney Leach agreed to give [place] permission to discuss obesity behavioral modification therapy today.  ASSESS: Sydney Leach has the diagnosis of obesity and her BMI today is 40.7. Sydney Leach is in the action stage of change.   ADVISE: Sydney Leach was educated on the multiple health risks of obesity as well as the benefit of weight loss to improve her health. She was advised of the need for long term treatment and the importance of lifestyle modifications to improve her current health and to decrease her risk of future health problems.  AGREE: Multiple dietary modification options and treatment options were discussed and Sydney Leach agreed to follow the recommendations documented in the above note.  ARRANGE: Sydney Leach was educated on the importance of frequent visits to treat obesity as outlined per CMS and USPSTF guidelines and agreed to schedule her next follow up appointment today.  Attestation Statements:   Reviewed by clinician on day of visit: allergies, medications, problem list, medical history, surgical history, family history, social history, and previous encounter notes.  I, Kathlene November, BS, CMA, am acting as transcriptionist for [employer], DO.  I have reviewed the above documentation for accuracy and completeness, and I agree with the above. -  ***

## 2021-12-05 DIAGNOSIS — R310 Gross hematuria: Secondary | ICD-10-CM | POA: Diagnosis not present

## 2021-12-05 DIAGNOSIS — N200 Calculus of kidney: Secondary | ICD-10-CM | POA: Diagnosis not present

## 2021-12-06 ENCOUNTER — Other Ambulatory Visit: Payer: Self-pay | Admitting: Urology

## 2021-12-10 NOTE — Progress Notes (Addendum)
Talked with patient. Out of town in [place]. Will be home on Wednesday. Instructions given has not has mobic or fish oil since Sunday 12/08/21 arrival time 0800 clear liquids until 0600.  Husband is the driver. To take regular AM meds as indicated  To bring CPAP

## 2021-12-10 NOTE — H&P (Signed)
Office Visit Report     12/05/2021   --------------------------------------------------------------------------------   Sydney Leach  MRN: [id number]  DOB: Jun 18, 1956, 66 year old Female  SSN:    PRIMARY CARE:  Jenean Lindau  REFERRING:  Jenean Lindau  PROVIDER:  Harold Barban, M.D.  LOCATION:  [facility] [phone]     --------------------------------------------------------------------------------   CC/HPI: Patient is a 66 year old obese white female presented with painless gross hematuria 2 days ago and [facility]. Subsequently had CT scan at [facility] shows a 7 mm right UPJ stone with no significant obstructive changes. No other obvious upper tract abnormality or bladder abnormality noted. Patient also noted to have cirrhotic changes involving the liver associated with portal venous hypertension CT is reviewed today on the PACS system report is as below. Patient has no prior history of stones.  Patient currently asymptomatic.  Micro urinalysis shows greater than 60 RBCs.  KUB is reviewed today showed no obvious bony abnormalities. There is a calcification adjacent to the right L3 transverse process which corresponds to stone seen on CT scan.   CLINICAL DATA: Gross hematuria of.   EXAM:  CT ABDOMEN AND PELVIS WITHOUT AND WITH CONTRAST   TECHNIQUE:  Multidetector CT imaging of the abdomen and pelvis was performed  following the standard protocol before and following the bolus  administration of intravenous contrast.   RADIATION DOSE REDUCTION: This exam was performed according to the  departmental dose-optimization program which includes automated  exposure control, adjustment of the mA and/or kV according to  patient size and/or use of iterative reconstruction technique.   CONTRAST: 100 cc Omnipaque 300   COMPARISON: CT scan from 2010   FINDINGS:  Lower chest: The lung bases are clear of an acute process. No  pulmonary  lesions or pleural effusions. No pericardial effusion.   Hepatobiliary: Cirrhotic changes involving the liver with associated  portal venous hypertension and portal venous collaterals. No hepatic  lesions are identified. No intrahepatic biliary dilatation. The  gallbladder is surgically absent. Small scattered calcified  granulomas are noted. No common bile duct dilatation.   Pancreas: No mass, inflammation or ductal dilatation.   Spleen: Normal size. Scattered calcified granulomas.   Adrenals/Urinary Tract: The adrenal glands are normal.   7 mm calculus noted at the right UPJ without hydronephrosis. The  remainder of the right ureter is unremarkable.   No left-sided renal or ureteral calculi.   Both kidneys demonstrate normal enhancement/perfusion. No worrisome  renal lesions. The delayed images do not demonstrate any significant  collecting system abnormalities. Contrast gets beyond the right UPJ  calculus.   The bladder is grossly normal. No mass or asymmetric wall  thickening. Exam somewhat limited by significant artifact from the  left total hip arthroplasty.   Stomach/Bowel: The stomach, duodenum, small bowel and colon are  grossly normal without oral contrast. No inflammatory changes, mass  lesions or obstructive findings.   Vascular/Lymphatic: The aorta is normal in caliber. No dissection.  The branch vessels are patent. The major venous structures are  patent. The portal and splenic veins are patent. No mesenteric or  retroperitoneal mass or adenopathy. Small scattered lymph nodes are  noted.   Reproductive: Surgically absent.   Other: No pelvic mass or adenopathy. No free pelvic fluid  collections. No inguinal mass or adenopathy. No abdominal wall  hernia or subcutaneous lesions.   Musculoskeletal: No significant bony findings. Advanced degenerative  disc disease noted at L4-5 and L5-S1.   IMPRESSION:  1.  7 mm nonobstructing right UPJ calculus, likely  responsible for  the patient's hematuria.  2. No worrisome renal or bladder lesions.  3. Cirrhotic changes involving the liver with associated portal  venous hypertension and portal venous collaterals. No worrisome  hepatic lesions.  4. Status post cholecystectomy. No biliary dilatation.    Electronically Signed  By: Marijo Sanes M.D.  On: 12/03/2021 14:50     ALLERGIES: Clindamycin  Doxycycline Erythromycin Hydrocodone Pneumococcal Vaccine Sulfa    MEDICATIONS: Lisinopril 20 mg tablet  Omeprazole 40 mg capsule,delayed release  Amoxicillin 500 mg tablet  Cetirizine Hcl 10 mg tablet  Janumet Xr 50 mg-500 mg tablet,extended release multiphase 24 hr  Meloxicam 15 mg tablet  Montelukast Sodium 10 mg tablet  Tramadol Hcl 50 mg tablet     GU PSH: None     PSH Notes: Lumbar spine microdiscectomy   NON-GU PSH: Cholecystectomy (laparoscopic) Hip Replacement, Left Hysterectomy Leg surgery (unspecified), Left     GU PMH: None   NON-GU PMH: Arthritis Cardiac murmur, unspecified Coronary Artery Disease Diabetes Type 2 GERD Hypercholesterolemia Hypertension Liver Disease Sleep Apnea    FAMILY HISTORY: Colon Cancer - Grandmother stroke - Mother   SOCIAL HISTORY: Marital Status: Married Ethnicity: Not Hispanic Or Latino; Race: White Current Smoking Status: Patient has never smoked.   Tobacco Use Assessment Completed: Used Tobacco in last 30 days? Does not use smokeless tobacco. Has never drank.  Does not use drugs. Drinks 2 caffeinated drinks per day.    REVIEW OF SYSTEMS:    GU Review Female:   Patient reports frequent urination and get up at night to urinate. Patient denies hard to postpone urination, burning /pain with urination, leakage of urine, stream starts and stops, trouble starting your stream, have to strain to urinate, and being pregnant.  Gastrointestinal (Upper):   Patient denies nausea, vomiting, and indigestion/ heartburn.  Gastrointestinal  (Lower):   Patient reports diarrhea and constipation.   Constitutional:   Patient reports fatigue. Patient denies fever, night sweats, and weight loss.  Skin:   Patient reports itching. Patient denies skin rash/ lesion.  Eyes:   Patient denies blurred vision and double vision.  Ears/ Nose/ Throat:   Patient reports sore throat and sinus problems.   Hematologic/Lymphatic:   Patient denies swollen glands and easy bruising.  Cardiovascular:   Patient reports leg swelling. Patient denies chest pains.  Respiratory:   Patient denies cough and shortness of breath.  Endocrine:   Patient denies excessive thirst.  Musculoskeletal:   Patient reports back pain and joint pain.   Neurological:   Patient reports headaches. Patient denies dizziness.  Psychologic:   Patient denies depression and anxiety.   Notes: hematuria    VITAL SIGNS:      12/05/2021 01:09 PM  Weight 239 lb / 108.41 kg  Height 63.5 in / 161.29 cm  BP 131/74 mmHg  Pulse 75 /min  Temperature 97.1 F / 36.1 C  BMI 41.7 kg/m   GU PHYSICAL EXAMINATION:    Breast: Symmetrical. No tenderness, no nipple discharge, no skin changes. No mass.  Digital Rectal Exam: Normal sphincter tone. No rectal mass.  External Genitalia: No hirsutism, no rash, no scarring, no cyst, no erythematous lesion, no papular lesion, no blanched lesion, no warty lesion. No edema.  Urethral Meatus: Normal size. Normal position. No discharge.  Urethra: No tenderness, no mass, no scarring. No hypermobility. No leakage.  Bladder: Normal to palpation, no tenderness, no mass, normal size.  Vagina: No atrophy, no stenosis.  No rectocele. No cystocele. No enterocele.  Cervix: S/P Hysterectomy  Uterus: S/P Hysterectomy  Adnexa / Parametria: No tenderness. No adnexal mass. Normal left ovary. Normal right ovary.  Anus and Perineum: No hemorrhoids. No anal stenosis. No rectal fissure, no anal fissure. No edema, no dimple, no perineal tenderness, no anal tenderness.    MULTI-SYSTEM PHYSICAL EXAMINATION:    Constitutional: Well-nourished. No physical deformities. Normally developed. Good grooming.  Neck: Neck symmetrical, not swollen. Normal tracheal position.  Respiratory: No labored breathing, no use of accessory muscles.   Cardiovascular: Normal temperature, normal extremity pulses, no swelling, no varicosities.  Lymphatic: No enlargement of neck, axillae, groin.  Skin: No paleness, no jaundice, no cyanosis. No lesion, no ulcer, no rash.  Neurologic / Psychiatric: Oriented to time, oriented to place, oriented to person. No depression, no anxiety, no agitation.  Gastrointestinal: No mass, no tenderness, no rigidity, non obese abdomen.  Eyes: Normal conjunctivae. Normal eyelids.  Ears, Nose, Mouth, and Throat: Left ear no scars, no lesions, no masses. Right ear no scars, no lesions, no masses. Nose no scars, no lesions, no masses. Normal hearing. Normal lips.  Musculoskeletal: Normal gait and station of head and neck.     Complexity of Data:  Source Of History:  Patient  Records Review:   Previous Doctor Records, Previous Hospital Records  Urine Test Review:   Urinalysis  X-Ray Review: KUB: Reviewed Films. Reviewed Report. Discussed With Patient.  C.T. Abdomen/Pelvis: Reviewed Films. Reviewed Report. Discussed With Patient.     PROCEDURES:         KUB - [id number]  A single view of the abdomen is obtained.      . Patient confirmed No Neulasta OnPro Device. KUB is reviewed today shows no obvious bony abnormalities. Has hip prosthesis in place on the left. There is a calcification noted off the right transverse process at L3 which seems to correspond to the stone seen on CT scan.          Urinalysis w/Scope - 81001 Dipstick Dipstick Cont'd Micro  Color: Yellow Bilirubin: Neg mg/dL WBC/hpf: 0 - 5/hpf  Appearance: Slightly Cloudy Ketones: Neg mg/dL RBC/hpf: >60/hpf  Specific Gravity: 1.010 Blood: 3+ ery/uL Bacteria: Few (10-25/hpf)  pH: 6.0 Protein:  Trace mg/dL Cystals: NS (Not Seen)  Glucose: Neg mg/dL Urobilinogen: 0.2 mg/dL Casts: NS (Not Seen)    Nitrites: Neg Trichomonas: Not Present    Leukocyte Esterase: Neg leu/uL Mucous: Not Present      Epithelial Cells: 0 - 5/hpf      Yeast: NS (Not Seen)      Sperm: Not Present    ASSESSMENT:      ICD-10 Details  1 GU:   Renal calculus - P29.5 Acute, Complicated Injury  2   Gross hematuria - J88.4 Acute, Complicated Injury   PLAN:           Document Letter(s):  Created for Patient: Clinical Summary         Notes:   Discussed treatment options with the patient. Discussed possibility of trial of passage but stone is 7 mm in likely would be symptomatic before passing. Also having gross hematuria. We discussed ureteroscopy and laser lithotripsy versus in situ ESL. After discussion wants to try in situ ESL risk and benefits discussed as outlined below we will schedule accordingly in the near future. Patient knows she will need to be off meloxicam prior to the procedure. I did tell the patient I cannot guarantee that the stone was cause for  the hematuria but was the likely source. I think the best management would be treatment of the stone first and see if hematuria resolves if not will need cystoscopy.  I have discussed with the patient the risks and consequences of the procedure of extracorporeal shockwave lithotripsy to include, but not limited to: Bleeding, including bleeding in the urine, bleeding around the kidney with hematoma formation and rarely bleeding to the point of loss of the kidney, infection, damage to the surrounding structures including soft tissue and bowel perforation, residual stone fragments requiring the need for future treatments including endoscopic surgery, open surgery or percutaneous surgery. I have emphasized that study showed that up to 25% of patients will require additional procedures depending on stone size and composition. I have also discussed with the patient that  the success rate for ESWL is approximately 60-90% and depends on stone location and stone composition as well as preoperative stone size. The patient voices understanding of the risks and benefits of the above and consents to the procedure.

## 2021-12-11 NOTE — Progress Notes (Signed)
Called patient to let her know that her time has changed to 0645 instead of 0800. Clear liquids to 0445.

## 2021-12-12 ENCOUNTER — Ambulatory Visit (HOSPITAL_COMMUNITY): Payer: PPO

## 2021-12-12 ENCOUNTER — Encounter (HOSPITAL_BASED_OUTPATIENT_CLINIC_OR_DEPARTMENT_OTHER): Payer: Self-pay | Admitting: Urology

## 2021-12-12 ENCOUNTER — Ambulatory Visit (INDEPENDENT_AMBULATORY_CARE_PROVIDER_SITE_OTHER): Payer: PPO | Admitting: Adult Health

## 2021-12-12 ENCOUNTER — Ambulatory Visit (HOSPITAL_BASED_OUTPATIENT_CLINIC_OR_DEPARTMENT_OTHER)
Admission: RE | Admit: 2021-12-12 | Discharge: 2021-12-12 | Disposition: A | Discharge status: Home / Self Care | Payer: PPO | Attending: Urology | Admitting: Urology

## 2021-12-12 ENCOUNTER — Encounter (HOSPITAL_BASED_OUTPATIENT_CLINIC_OR_DEPARTMENT_OTHER)
Admission: RE | Disposition: A | Discharge status: Home / Self Care | Payer: Self-pay | Source: Home / Self Care | Attending: Urology

## 2021-12-12 DIAGNOSIS — N201 Calculus of ureter: Secondary | ICD-10-CM | POA: Insufficient documentation

## 2021-12-12 DIAGNOSIS — R310 Gross hematuria: Secondary | ICD-10-CM | POA: Insufficient documentation

## 2021-12-12 DIAGNOSIS — G4730 Sleep apnea, unspecified: Secondary | ICD-10-CM | POA: Insufficient documentation

## 2021-12-12 DIAGNOSIS — E669 Obesity, unspecified: Secondary | ICD-10-CM | POA: Diagnosis not present

## 2021-12-12 DIAGNOSIS — E119 Type 2 diabetes mellitus without complications: Secondary | ICD-10-CM | POA: Diagnosis not present

## 2021-12-12 DIAGNOSIS — Z01818 Encounter for other preprocedural examination: Secondary | ICD-10-CM | POA: Diagnosis not present

## 2021-12-12 DIAGNOSIS — D71 Functional disorders of polymorphonuclear neutrophils: Secondary | ICD-10-CM | POA: Diagnosis not present

## 2021-12-12 DIAGNOSIS — Z87442 Personal history of urinary calculi: Secondary | ICD-10-CM | POA: Diagnosis not present

## 2021-12-12 DIAGNOSIS — Z6841 Body Mass Index (BMI) 40.0 and over, adult: Secondary | ICD-10-CM | POA: Insufficient documentation

## 2021-12-12 DIAGNOSIS — D7389 Other diseases of spleen: Secondary | ICD-10-CM | POA: Diagnosis not present

## 2021-12-12 HISTORY — PX: EXTRACORPOREAL SHOCK WAVE LITHOTRIPSY: SHX1557

## 2021-12-12 LAB — GLUCOSE, CAPILLARY: Glucose-Capillary: 116 mg/dL — ABNORMAL HIGH (ref 70–99)

## 2021-12-12 SURGERY — LITHOTRIPSY, ESWL
Anesthesia: LOCAL | Laterality: Right

## 2021-12-12 MED ORDER — DIAZEPAM 5 MG PO TABS
10.0000 mg | ORAL_TABLET | ORAL | Status: AC
  Administered 2021-12-12: 10 mg via ORAL

## 2021-12-12 MED ORDER — POTASSIUM CHLORIDE IN NACL 20-0.9 MEQ/L-% IV SOLN
INTRAVENOUS | Status: DC
  Filled 2021-12-12: qty 1000

## 2021-12-12 MED ORDER — CIPROFLOXACIN HCL 500 MG PO TABS
500.0000 mg | ORAL_TABLET | ORAL | Status: AC
  Administered 2021-12-12: 500 mg via ORAL

## 2021-12-12 MED ORDER — DIPHENHYDRAMINE HCL 25 MG PO CAPS
ORAL_CAPSULE | ORAL | Status: AC
  Filled 2021-12-12: qty 1

## 2021-12-12 MED ORDER — CIPROFLOXACIN HCL 500 MG PO TABS
ORAL_TABLET | ORAL | Status: AC
  Filled 2021-12-12: qty 1

## 2021-12-12 MED ORDER — SODIUM CHLORIDE 0.9 % IV SOLN: INTRAVENOUS | Status: DC

## 2021-12-12 MED ORDER — DIAZEPAM 5 MG PO TABS
ORAL_TABLET | ORAL | Status: AC
  Filled 2021-12-12: qty 2

## 2021-12-12 MED ORDER — DIPHENHYDRAMINE HCL 25 MG PO CAPS
25.0000 mg | ORAL_CAPSULE | ORAL | Status: AC
  Administered 2021-12-12: 25 mg via ORAL

## 2021-12-12 NOTE — Discharge Instructions (Addendum)
I have reviewed discharge instructions in detail with the patient. They will follow-up with me or their physician as scheduled. My nurse will also be calling the patients as per protocol.     Post Anesthesia Home Care Instructions  Activity: Get plenty of rest for the remainder of the day. A responsible individual must stay with you for 24 hours following the procedure.  For the next 24 hours, DO NOT: -Drive a car -Paediatric nurse -Drink alcoholic beverages -Take any medication unless instructed by your physician -Make any legal decisions or sign important papers.  Meals: Start with liquid foods such as gelatin or soup. Progress to regular foods as tolerated. Avoid greasy, spicy, heavy foods. If nausea and/or vomiting occur, drink only clear liquids until the nausea and/or vomiting subsides. Call your physician if vomiting continues.

## 2021-12-13 ENCOUNTER — Encounter (HOSPITAL_BASED_OUTPATIENT_CLINIC_OR_DEPARTMENT_OTHER): Payer: Self-pay | Admitting: Urology

## 2021-12-23 DIAGNOSIS — E785 Hyperlipidemia, unspecified: Secondary | ICD-10-CM | POA: Diagnosis not present

## 2021-12-23 DIAGNOSIS — E119 Type 2 diabetes mellitus without complications: Secondary | ICD-10-CM | POA: Diagnosis not present

## 2021-12-23 DIAGNOSIS — K7460 Unspecified cirrhosis of liver: Secondary | ICD-10-CM | POA: Diagnosis not present

## 2021-12-23 DIAGNOSIS — M1990 Unspecified osteoarthritis, unspecified site: Secondary | ICD-10-CM | POA: Diagnosis not present

## 2021-12-23 DIAGNOSIS — I10 Essential (primary) hypertension: Secondary | ICD-10-CM | POA: Diagnosis not present

## 2021-12-26 DIAGNOSIS — R310 Gross hematuria: Secondary | ICD-10-CM | POA: Diagnosis not present

## 2021-12-26 DIAGNOSIS — N200 Calculus of kidney: Secondary | ICD-10-CM | POA: Diagnosis not present

## 2022-01-08 ENCOUNTER — Encounter: Payer: Self-pay | Admitting: Pharmacist

## 2022-01-08 DIAGNOSIS — Z9189 Other specified personal risk factors, not elsewhere classified: Secondary | ICD-10-CM

## 2022-01-08 NOTE — Progress Notes (Signed)
[facility])                                            [place] Team                                        Statin Quality Measure Assessment    01/08/2022  Sydney Leach 1955/10/28 [id number]      Per review of chart and payor information, patient has a diagnosis of diabetes but is not currently filling a statin prescription.  This places patient into the SUPD (Statin Use In Patients with Diabetes) measure for CMS.    I could not find any documentation of previous trial of a statin or a history of statin intolerance.  Patient has an upcoming appointment 01/09/22.  If deemed therapeutically appropriate, statin therapy could be evaluated during that visit. If patient has statin intolerance, one of the statin exclusion codes listed below could be used to remove the patient from the visit.  The 10-year ASCVD risk score (Arnett DK, et al., 2019) is: 19.3%*   Values used to calculate the score:     Age: 66 years     Sex: Female     Is Non-Hispanic African American: No     Diabetic: Yes     Tobacco smoker: No     Systolic Blood Pressure: 229 mmHg     Is BP treated: Yes     HDL Cholesterol: 76 mg/dL*     Total Cholesterol: 166 mg/dL*     * - Cholesterol units were assumed for this score calculation 09/19/2021     Component Value Date/Time   CHOL 166 09/19/2021 0000   TRIG 73 09/19/2021 0000   HDL 76 (A) 09/19/2021 0000   LDLCALC 76 09/19/2021 0000    Please consider ONE of the following recommendations:  Initiate high intensity statin Atorvastatin '40mg'$  once daily, #90, 3 refills   Rosuvastatin '20mg'$  once daily, #90, 3 refills    Initiate moderate intensity          statin with reduced frequency if prior          statin intolerance 1x weekly, #13, 3 refills   2x weekly, #26, 3 refills   3x weekly, #39, 3 refills    Code for past statin intolerance or  other exclusions (required annually)    Provider  Requirements:  Associate code during an office visit or telehealth encounter  Drug Induced Myopathy G72.0   Myopathy, unspecified G72.9   Myositis, unspecified M60.9   Rhabdomyolysis N98.92   Alcoholic fatty liver J19.4   Cirrhosis of liver K74.69   Prediabetes R73.03   PCOS E28.2   Toxic liver disease, unspecified K71.9         Plan: Route note to PCP prior to visit    Elayne Guerin, PharmD, [place] Clinical Pharmacist [phone]

## 2022-01-09 ENCOUNTER — Encounter (INDEPENDENT_AMBULATORY_CARE_PROVIDER_SITE_OTHER): Payer: Self-pay | Admitting: Adult Health

## 2022-01-09 ENCOUNTER — Ambulatory Visit (INDEPENDENT_AMBULATORY_CARE_PROVIDER_SITE_OTHER): Payer: PPO | Admitting: Adult Health

## 2022-01-09 VITALS — BP 124/72 | HR 70 | Temp 97.8°F | Ht 64.0 in | Wt 236.0 lb

## 2022-01-09 DIAGNOSIS — E1169 Type 2 diabetes mellitus with other specified complication: Secondary | ICD-10-CM | POA: Diagnosis not present

## 2022-01-09 DIAGNOSIS — Z6841 Body Mass Index (BMI) 40.0 and over, adult: Secondary | ICD-10-CM

## 2022-01-09 DIAGNOSIS — Z7984 Long term (current) use of oral hypoglycemic drugs: Secondary | ICD-10-CM

## 2022-01-09 DIAGNOSIS — K760 Fatty (change of) liver, not elsewhere classified: Secondary | ICD-10-CM | POA: Diagnosis not present

## 2022-01-09 DIAGNOSIS — E785 Hyperlipidemia, unspecified: Secondary | ICD-10-CM

## 2022-01-09 DIAGNOSIS — K7460 Unspecified cirrhosis of liver: Secondary | ICD-10-CM | POA: Diagnosis not present

## 2022-01-09 DIAGNOSIS — E669 Obesity, unspecified: Secondary | ICD-10-CM

## 2022-01-10 ENCOUNTER — Other Ambulatory Visit: Payer: Self-pay | Admitting: Internal Medicine

## 2022-01-10 DIAGNOSIS — Z1231 Encounter for screening mammogram for malignant neoplasm of breast: Secondary | ICD-10-CM

## 2022-01-13 DIAGNOSIS — E1169 Type 2 diabetes mellitus with other specified complication: Secondary | ICD-10-CM | POA: Insufficient documentation

## 2022-01-13 NOTE — Progress Notes (Signed)
Chief Complaint:   OBESITY Sydney Leach is here to discuss her progress with her obesity treatment plan along with follow-up of her obesity related diagnoses. Sydney Leach is on the Category 2 Plan and keeping a food journal and adhering to recommended goals of 1300-1400 calories and 100+ grams of protein daily and states she is following her eating plan approximately 80% of the time. Sydney Leach states she is doing yard work, house work and walking for 60 minutes 3 times per week.  Today's visit was #: 5 Starting weight: 241 lbs Starting date: 10/15/2021 Today's weight: 236 lbs Today's date: 01/09/2022 Total lbs lost to date: 5 Total lbs lost since last in-office visit: 1  Interim History:  Since Sydney Leach's last office visit she notes right nephrolithiasis.   Underwent EXTRACORPOREAL SHOCK WAVE LITHOTRIPSY (ESWL) on 12/12/21  On 12/12/2021 Hospital Notes: CC/HPI: Patient is a 67 year old obese white female presented with painless gross hematuria 2 days ago and [facility]. Subsequently had CT scan at [facility] shows a 7 mm right UPJ stone with no significant obstructive changes. No other obvious upper tract abnormality or bladder abnormality noted. Patient also noted to have cirrhotic changes involving the liver associated with portal venous hypertension CT is reviewed today on the PACS system report is as below. Patient has no prior history of stones.  Patient currently asymptomatic.  Micro urinalysis shows greater than 60 RBCs.  KUB is reviewed today showed no obvious bony abnormalities. There is a calcification adjacent to the right L3 transverse process which corresponds to stone seen on CT scan.   CT ABDOMEN AND PELVIS WITHOUT AND WITH CONTRAST  IMPRESSION:  1. 7 mm nonobstructing right UPJ calculus, likely responsible for  the patient's hematuria.  2. No worrisome renal or bladder lesions.  3. Cirrhotic changes involving the liver with associated portal  venous  hypertension and portal venous collaterals. No worrisome  hepatic lesions.  4. Status post cholecystectomy. No biliary dilatation.   She has a follow-up with her established GI/Dr. Melina Copa later today.    She has increased her water intake to 6 glasses of water per day.  Subjective:   1. Hyperlipidemia associated with type 2 diabetes mellitus ([place]) Sydney Leach is on fish oil 1000 mg once daily.   Her ASCVD risk score is 15%.   She was on simvastatin for >10 years, however she stopped due to concerns of medication side effects.  2. Type 2 diabetes mellitus with other specified complication, without long-term current use of insulin (HCC) Sydney Leach's PCP manages Janumet 50-1000 mg once daily- only current antidiabetic therapy at present. She notes metformin caused severe diarrhea.   Victoza/Trulicity/Ozempic/Mounjaro- have caused severe nausea/vomiting.    3. NAFLD (nonalcoholic fatty liver disease) CT ABDOMEN AND PELVIS WITHOUT AND WITH CONTRAST  IMPRESSION:  1. 7 mm nonobstructing right UPJ calculus, likely responsible for  the patient's hematuria.  2. No worrisome renal or bladder lesions.  3. Cirrhotic changes involving the liver with associated portal  venous hypertension and portal venous collaterals. No worrisome  hepatic lesions.  4. Status post cholecystectomy. No biliary dilatation.   Assessment/Plan:   1. Hyperlipidemia associated with type 2 diabetes mellitus ([place]) Pelagia will follow-up with either her PCP or her cardiologist in regards to uncontrolled hyperlipidemia.  2. Type 2 diabetes mellitus with other specified complication, without long-term current use of insulin ([place]) Sydney Leach will continue Janumet 50-1000 mg per her PCP.  3. NAFLD (nonalcoholic fatty liver disease) Sydney Leach will follow-up with Dr. Melina Copa, GI  today.  4. Obesity, current BMI 40.6 Sydney Leach is currently in the action stage of change. As such, her goal is to continue with weight loss efforts. She has  agreed to the Category 2 Plan and keeping a food journal and adhering to recommended goals of 1300-1400 calories and 100+ grams of protein daily.   Exercise goals: As is.   Behavioral modification strategies: increasing lean protein intake, decreasing simple carbohydrates, meal planning and cooking strategies, keeping healthy foods in the home, planning for success, and keeping a strict food journal.  Sydney Leach has agreed to follow-up with our clinic in 3 to 4 weeks. She was informed of the importance of frequent follow-up visits to maximize her success with intensive lifestyle modifications for her multiple health conditions.   Objective:   Blood pressure 124/72, pulse 70, temperature 97.8 F (36.6 C), height '5\' 4"'$  (1.626 m), weight 236 lb (107 kg), SpO2 96 %. Body mass index is 40.51 kg/m.  General: Cooperative, alert, well developed, in no acute distress. HEENT: Conjunctivae and lids unremarkable. Cardiovascular: Regular rhythm.  Lungs: Normal work of breathing. Neurologic: No focal deficits.   Lab Results  Component Value Date   CREATININE 0.7 09/19/2021   BUN 15 09/19/2021   NA 136 (A) 09/19/2021   K 4.5 09/19/2021   CL 99 09/19/2021   CO2 25 (A) 09/19/2021   Lab Results  Component Value Date   ALT 56 (A) 09/19/2021   AST 80 (A) 09/19/2021   ALKPHOS 165 (A) 09/19/2021   BILITOT 0.8 05/24/2019   Lab Results  Component Value Date   HGBA1C 7.3 09/19/2021   HGBA1C 6.1 (H) 05/24/2019   Lab Results  Component Value Date   INSULIN 37.8 (H) 10/15/2021   INSULIN 21.3 01/12/2019   Lab Results  Component Value Date   TSH 1.280 10/15/2021   Lab Results  Component Value Date   CHOL 166 09/19/2021   HDL 76 (A) 09/19/2021   LDLCALC 76 09/19/2021   TRIG 73 09/19/2021   Lab Results  Component Value Date   VD25OH 56.5 10/15/2021   VD25OH 68.9 01/12/2019   Lab Results  Component Value Date   WBC 12.4 (H) 05/29/2019   HGB 9.3 (L) 05/29/2019   HCT 28.9 (L) 05/29/2019    MCV 93.2 05/29/2019   PLT 187 05/29/2019   No results found for: "IRON", "TIBC", "FERRITIN"  Attestation Statements:   Reviewed by clinician on day of visit: allergies, medications, problem list, medical history, surgical history, family history, social history, and previous encounter notes.  Time spent on visit including pre-visit chart review and post-visit care and charting was 29 minutes.   Wilhemena Durie, am acting as transcriptionist for Mina Marble, NP.  I have reviewed the above documentation for accuracy and completeness, and I agree with the above. -  Judith Demps d. Oliana Gowens, NP-C

## 2022-01-22 ENCOUNTER — Ambulatory Visit: Payer: PPO

## 2022-01-22 ENCOUNTER — Ambulatory Visit
Admission: RE | Admit: 2022-01-22 | Discharge: 2022-01-22 | Disposition: A | Discharge status: Home / Self Care | Payer: PPO | Source: Ambulatory Visit | Attending: Internal Medicine | Admitting: Internal Medicine

## 2022-01-22 DIAGNOSIS — Z1231 Encounter for screening mammogram for malignant neoplasm of breast: Secondary | ICD-10-CM | POA: Diagnosis not present

## 2022-01-24 ENCOUNTER — Other Ambulatory Visit: Payer: Self-pay | Admitting: Internal Medicine

## 2022-01-24 DIAGNOSIS — R928 Other abnormal and inconclusive findings on diagnostic imaging of breast: Secondary | ICD-10-CM

## 2022-01-29 ENCOUNTER — Encounter (INDEPENDENT_AMBULATORY_CARE_PROVIDER_SITE_OTHER): Payer: Self-pay

## 2022-01-30 ENCOUNTER — Ambulatory Visit: Payer: PPO

## 2022-01-30 ENCOUNTER — Ambulatory Visit
Admission: RE | Admit: 2022-01-30 | Discharge: 2022-01-30 | Disposition: A | Discharge status: Home / Self Care | Payer: PPO | Source: Ambulatory Visit | Attending: Internal Medicine | Admitting: Internal Medicine

## 2022-01-30 DIAGNOSIS — R928 Other abnormal and inconclusive findings on diagnostic imaging of breast: Secondary | ICD-10-CM | POA: Diagnosis not present

## 2022-01-31 ENCOUNTER — Other Ambulatory Visit: Payer: PPO

## 2022-02-05 ENCOUNTER — Ambulatory Visit: Payer: PPO | Admitting: Nurse Practitioner

## 2022-02-06 ENCOUNTER — Encounter (INDEPENDENT_AMBULATORY_CARE_PROVIDER_SITE_OTHER): Payer: Self-pay | Admitting: Adult Health

## 2022-02-06 ENCOUNTER — Ambulatory Visit (INDEPENDENT_AMBULATORY_CARE_PROVIDER_SITE_OTHER): Payer: PPO | Admitting: Adult Health

## 2022-02-06 VITALS — BP 134/75 | HR 78 | Temp 98.8°F | Ht 64.0 in | Wt 236.0 lb

## 2022-02-06 DIAGNOSIS — Z6841 Body Mass Index (BMI) 40.0 and over, adult: Secondary | ICD-10-CM

## 2022-02-06 DIAGNOSIS — E1169 Type 2 diabetes mellitus with other specified complication: Secondary | ICD-10-CM | POA: Diagnosis not present

## 2022-02-06 DIAGNOSIS — Z7984 Long term (current) use of oral hypoglycemic drugs: Secondary | ICD-10-CM | POA: Diagnosis not present

## 2022-02-06 DIAGNOSIS — E669 Obesity, unspecified: Secondary | ICD-10-CM

## 2022-02-06 DIAGNOSIS — K760 Fatty (change of) liver, not elsewhere classified: Secondary | ICD-10-CM | POA: Diagnosis not present

## 2022-02-12 NOTE — Progress Notes (Signed)
Chief Complaint:   OBESITY Sydney Leach is here to discuss her progress with her obesity treatment plan along with follow-up of her obesity related diagnoses. Sydney Leach is on Category 2 Plan and keeping a food journal and adhering to recommended goals of 1300-1400 calories and 100+ grams of protein daily and states she is following her eating plan approximately 80% of the time.  Sydney Leach states she is not exercising.   Today's visit was #: 6  Starting weight: 241 lbs Starting date: 10/15/2021 Today's weight: 236 lbs Today's date: 02/06/2022 Total lbs lost to date: 5 lbs Total lbs lost since last in-office visit: 0  Interim History:  Last week she experienced five days of diarrhea, nausea, vomiting.   Her last day of acute symptoms was 01/30/2022.   She believes to have picked up a "GI bug" at [employer].    12/23/21, at PCP office visit had labs per patient, her A1c was 6.4.  Subjective:   1. Type 2 diabetes mellitus with other specified complication, without long-term current use of insulin (Sydney Leach) 09/19/2021 A1c was 7.3. She has not been checking her blood glucose at home.   She denies symptoms of hypoglycemia.  She is on daily Janumet 50/1000 mg, not taking BID as ordered.   2. Non-alcoholic fatty liver disease 09/19/2021, LFT's all above goal.   She reports intermittens RUQ pain.   She denies acute GI symptoms at present.  Assessment/Plan:   1. Type 2 diabetes mellitus with other specified complication, without long-term current use of insulin (Sydney Leach) Continue Janumet per PCP.  2. Non-alcoholic fatty liver disease Continue to avoid acetaminophen and ETOH.   Complete scheduled EGD on 02/19/22.  3. Obesity, current BMI 40.6 Sydney Leach is currently in the action stage of change. As such, her goal is to continue with weight loss efforts. She has Leach to the Category 2 Plan.   Exercise goals: All adults should avoid inactivity. Some physical activity is better than none, and  adults who participate in any amount of physical activity gain some health benefits.  Behavioral modification strategies: increasing lean protein intake, decreasing simple carbohydrates, meal planning and cooking strategies, keeping healthy foods in the home, travel eating strategies, and planning for success.  Sydney Leach has Leach to follow-up with our clinic in 4 weeks. She was informed of the importance of frequent follow-up visits to maximize her success with intensive lifestyle modifications for her multiple health conditions.   Objective:   Blood pressure 134/75, pulse 78, temperature 98.8 F (37.1 C), height '5\' 4"'$  (1.626 m), weight 236 lb (107 kg), SpO2 96 %. Body mass index is 40.51 kg/m.  General: Cooperative, alert, well developed, in no acute distress. HEENT: Conjunctivae and lids unremarkable. Cardiovascular: Regular rhythm.  Lungs: Normal work of breathing. Neurologic: No focal deficits.   Lab Results  Component Value Date   CREATININE 0.7 09/19/2021   BUN 15 09/19/2021   NA 136 (A) 09/19/2021   K 4.5 09/19/2021   CL 99 09/19/2021   CO2 25 (A) 09/19/2021   Lab Results  Component Value Date   ALT 56 (A) 09/19/2021   AST 80 (A) 09/19/2021   ALKPHOS 165 (A) 09/19/2021   BILITOT 0.8 05/24/2019   Lab Results  Component Value Date   HGBA1C 7.3 09/19/2021   HGBA1C 6.1 (H) 05/24/2019   Lab Results  Component Value Date   INSULIN 37.8 (H) 10/15/2021   INSULIN 21.3 01/12/2019   Lab Results  Component Value Date   TSH  1.280 10/15/2021   Lab Results  Component Value Date   CHOL 166 09/19/2021   HDL 76 (A) 09/19/2021   LDLCALC 76 09/19/2021   TRIG 73 09/19/2021   Lab Results  Component Value Date   VD25OH 56.5 10/15/2021   VD25OH 68.9 01/12/2019   Lab Results  Component Value Date   WBC 12.4 (H) 05/29/2019   HGB 9.3 (L) 05/29/2019   HCT 28.9 (L) 05/29/2019   MCV 93.2 05/29/2019   PLT 187 05/29/2019   No results found for: "IRON", "TIBC",  "FERRITIN"  Obesity Behavioral Intervention:   Approximately 15 minutes were spent on the discussion below.  ASK: We discussed the diagnosis of obesity with Sydney Leach today and Sydney Leach to give [place] permission to discuss obesity behavioral modification therapy today.  ASSESS: Sydney Leach has the diagnosis of obesity and her BMI today is 40. Sydney Leach is in the action stage of change.   ADVISE: Sydney Leach was educated on the multiple health risks of obesity as well as the benefit of weight loss to improve her health. She was advised of the need for long term treatment and the importance of lifestyle modifications to improve her current health and to decrease her risk of future health problems.  AGREE: Multiple dietary modification options and treatment options were discussed and Sydney Leach Leach to follow the recommendations documented in the above note.  ARRANGE: Sydney Leach was educated on the importance of frequent visits to treat obesity as outlined per CMS and USPSTF guidelines and Leach to schedule her next follow up appointment today.  Attestation Statements:   Reviewed by clinician on day of visit: allergies, medications, problem list, medical history, surgical history, family history, social history, and previous encounter notes.  Time spent on visit including pre-visit chart review and post-visit care and charting was 28 minutes.   I, Davy Pique, RMA, am acting as Location manager for Mina Marble, NP.  I have reviewed the above documentation for accuracy and completeness, and I agree with the above. -  Cristela Stalder d. Moriyah Byington, NP-C

## 2022-02-17 DIAGNOSIS — N200 Calculus of kidney: Secondary | ICD-10-CM | POA: Diagnosis not present

## 2022-02-19 DIAGNOSIS — R195 Other fecal abnormalities: Secondary | ICD-10-CM | POA: Diagnosis not present

## 2022-02-19 DIAGNOSIS — D500 Iron deficiency anemia secondary to blood loss (chronic): Secondary | ICD-10-CM | POA: Diagnosis not present

## 2022-02-19 DIAGNOSIS — K7460 Unspecified cirrhosis of liver: Secondary | ICD-10-CM | POA: Diagnosis not present

## 2022-02-28 DIAGNOSIS — G4733 Obstructive sleep apnea (adult) (pediatric): Secondary | ICD-10-CM | POA: Diagnosis not present

## 2022-03-06 ENCOUNTER — Ambulatory Visit (INDEPENDENT_AMBULATORY_CARE_PROVIDER_SITE_OTHER): Payer: PPO | Admitting: Adult Health

## 2022-03-25 DIAGNOSIS — E785 Hyperlipidemia, unspecified: Secondary | ICD-10-CM | POA: Diagnosis not present

## 2022-03-25 DIAGNOSIS — M1990 Unspecified osteoarthritis, unspecified site: Secondary | ICD-10-CM | POA: Diagnosis not present

## 2022-03-25 DIAGNOSIS — Z23 Encounter for immunization: Secondary | ICD-10-CM | POA: Diagnosis not present

## 2022-03-25 DIAGNOSIS — K7460 Unspecified cirrhosis of liver: Secondary | ICD-10-CM | POA: Diagnosis not present

## 2022-03-25 DIAGNOSIS — N951 Menopausal and female climacteric states: Secondary | ICD-10-CM | POA: Diagnosis not present

## 2022-03-25 DIAGNOSIS — E119 Type 2 diabetes mellitus without complications: Secondary | ICD-10-CM | POA: Diagnosis not present

## 2022-03-25 DIAGNOSIS — I10 Essential (primary) hypertension: Secondary | ICD-10-CM | POA: Diagnosis not present

## 2022-04-22 DIAGNOSIS — E1169 Type 2 diabetes mellitus with other specified complication: Secondary | ICD-10-CM | POA: Diagnosis not present

## 2022-05-16 DIAGNOSIS — I10 Essential (primary) hypertension: Secondary | ICD-10-CM | POA: Diagnosis not present

## 2022-05-16 DIAGNOSIS — E119 Type 2 diabetes mellitus without complications: Secondary | ICD-10-CM | POA: Diagnosis not present

## 2022-05-16 DIAGNOSIS — M1990 Unspecified osteoarthritis, unspecified site: Secondary | ICD-10-CM | POA: Diagnosis not present

## 2022-05-16 DIAGNOSIS — K7460 Unspecified cirrhosis of liver: Secondary | ICD-10-CM | POA: Diagnosis not present

## 2022-05-16 DIAGNOSIS — N951 Menopausal and female climacteric states: Secondary | ICD-10-CM | POA: Diagnosis not present

## 2022-05-16 DIAGNOSIS — R61 Generalized hyperhidrosis: Secondary | ICD-10-CM | POA: Diagnosis not present

## 2022-05-16 DIAGNOSIS — E785 Hyperlipidemia, unspecified: Secondary | ICD-10-CM | POA: Diagnosis not present

## 2022-05-22 DIAGNOSIS — D649 Anemia, unspecified: Secondary | ICD-10-CM | POA: Diagnosis not present

## 2022-06-04 DIAGNOSIS — G4733 Obstructive sleep apnea (adult) (pediatric): Secondary | ICD-10-CM | POA: Diagnosis not present

## 2022-06-06 DIAGNOSIS — N951 Menopausal and female climacteric states: Secondary | ICD-10-CM | POA: Diagnosis not present

## 2022-06-06 DIAGNOSIS — D649 Anemia, unspecified: Secondary | ICD-10-CM | POA: Diagnosis not present

## 2022-06-06 DIAGNOSIS — K7460 Unspecified cirrhosis of liver: Secondary | ICD-10-CM | POA: Diagnosis not present

## 2022-06-06 DIAGNOSIS — Z8249 Family history of ischemic heart disease and other diseases of the circulatory system: Secondary | ICD-10-CM | POA: Diagnosis not present

## 2022-06-18 DIAGNOSIS — J209 Acute bronchitis, unspecified: Secondary | ICD-10-CM | POA: Diagnosis not present

## 2022-06-25 ENCOUNTER — Telehealth (INDEPENDENT_AMBULATORY_CARE_PROVIDER_SITE_OTHER): Payer: Self-pay | Admitting: Adult Health

## 2022-06-25 NOTE — Telephone Encounter (Signed)
1/3 patient is calling in about her bill from may. Patient stated that copay was billed as a specialty but was suspose to be billed as a primary care. Advised patient to talk to billing but didnt want to talk to anyone else but Freight forwarder. Ardeen Fillers

## 2022-07-04 DIAGNOSIS — I10 Essential (primary) hypertension: Secondary | ICD-10-CM | POA: Diagnosis not present

## 2022-07-04 DIAGNOSIS — K7460 Unspecified cirrhosis of liver: Secondary | ICD-10-CM | POA: Diagnosis not present

## 2022-07-04 DIAGNOSIS — N951 Menopausal and female climacteric states: Secondary | ICD-10-CM | POA: Diagnosis not present

## 2022-07-04 DIAGNOSIS — E119 Type 2 diabetes mellitus without complications: Secondary | ICD-10-CM | POA: Diagnosis not present

## 2022-07-04 DIAGNOSIS — E785 Hyperlipidemia, unspecified: Secondary | ICD-10-CM | POA: Diagnosis not present

## 2022-07-04 DIAGNOSIS — D509 Iron deficiency anemia, unspecified: Secondary | ICD-10-CM | POA: Diagnosis not present

## 2022-07-04 DIAGNOSIS — M1990 Unspecified osteoarthritis, unspecified site: Secondary | ICD-10-CM | POA: Diagnosis not present

## 2022-07-21 NOTE — Telephone Encounter (Signed)
Patient is questioning DOS 11/13/21.  States that she has never had to pay a copay at any of her other OV's with [facility].  States she was charged a $25.00 copay and that this was sent to bad debt.  I have sent an email to billing requesting the copay to be pulled back while in review and to follow back up with the patient in regard.

## 2022-08-14 DIAGNOSIS — Z85828 Personal history of other malignant neoplasm of skin: Secondary | ICD-10-CM | POA: Diagnosis not present

## 2022-08-14 DIAGNOSIS — L821 Other seborrheic keratosis: Secondary | ICD-10-CM | POA: Diagnosis not present

## 2022-08-14 DIAGNOSIS — D485 Neoplasm of uncertain behavior of skin: Secondary | ICD-10-CM | POA: Diagnosis not present

## 2022-08-14 DIAGNOSIS — L2089 Other atopic dermatitis: Secondary | ICD-10-CM | POA: Diagnosis not present

## 2022-08-15 NOTE — Telephone Encounter (Signed)
Copay was pulled back.  Sent billing email for update.

## 2022-09-18 DIAGNOSIS — K7460 Unspecified cirrhosis of liver: Secondary | ICD-10-CM | POA: Diagnosis not present

## 2022-10-08 DIAGNOSIS — G4733 Obstructive sleep apnea (adult) (pediatric): Secondary | ICD-10-CM | POA: Diagnosis not present

## 2022-10-25 DIAGNOSIS — E785 Hyperlipidemia, unspecified: Secondary | ICD-10-CM | POA: Diagnosis not present

## 2022-10-25 DIAGNOSIS — Z6841 Body Mass Index (BMI) 40.0 and over, adult: Secondary | ICD-10-CM | POA: Diagnosis not present

## 2022-10-25 DIAGNOSIS — I10 Essential (primary) hypertension: Secondary | ICD-10-CM | POA: Diagnosis not present

## 2022-10-25 DIAGNOSIS — N289 Disorder of kidney and ureter, unspecified: Secondary | ICD-10-CM | POA: Diagnosis not present

## 2022-10-25 DIAGNOSIS — M1990 Unspecified osteoarthritis, unspecified site: Secondary | ICD-10-CM | POA: Diagnosis not present

## 2022-10-25 DIAGNOSIS — D509 Iron deficiency anemia, unspecified: Secondary | ICD-10-CM | POA: Diagnosis not present

## 2022-10-25 DIAGNOSIS — E119 Type 2 diabetes mellitus without complications: Secondary | ICD-10-CM | POA: Diagnosis not present

## 2023-01-14 ENCOUNTER — Other Ambulatory Visit: Payer: Self-pay | Admitting: Internal Medicine

## 2023-01-14 DIAGNOSIS — Z1231 Encounter for screening mammogram for malignant neoplasm of breast: Secondary | ICD-10-CM

## 2023-01-20 DIAGNOSIS — G4733 Obstructive sleep apnea (adult) (pediatric): Secondary | ICD-10-CM | POA: Diagnosis not present

## 2023-01-27 DIAGNOSIS — M1990 Unspecified osteoarthritis, unspecified site: Secondary | ICD-10-CM | POA: Diagnosis not present

## 2023-01-27 DIAGNOSIS — N951 Menopausal and female climacteric states: Secondary | ICD-10-CM | POA: Diagnosis not present

## 2023-01-27 DIAGNOSIS — I10 Essential (primary) hypertension: Secondary | ICD-10-CM | POA: Diagnosis not present

## 2023-01-27 DIAGNOSIS — N289 Disorder of kidney and ureter, unspecified: Secondary | ICD-10-CM | POA: Diagnosis not present

## 2023-01-27 DIAGNOSIS — E785 Hyperlipidemia, unspecified: Secondary | ICD-10-CM | POA: Diagnosis not present

## 2023-01-27 DIAGNOSIS — Z6841 Body Mass Index (BMI) 40.0 and over, adult: Secondary | ICD-10-CM | POA: Diagnosis not present

## 2023-01-27 DIAGNOSIS — K7460 Unspecified cirrhosis of liver: Secondary | ICD-10-CM | POA: Diagnosis not present

## 2023-01-27 DIAGNOSIS — D509 Iron deficiency anemia, unspecified: Secondary | ICD-10-CM | POA: Diagnosis not present

## 2023-01-27 DIAGNOSIS — Z1239 Encounter for other screening for malignant neoplasm of breast: Secondary | ICD-10-CM | POA: Diagnosis not present

## 2023-01-27 DIAGNOSIS — E1169 Type 2 diabetes mellitus with other specified complication: Secondary | ICD-10-CM | POA: Diagnosis not present

## 2023-02-04 ENCOUNTER — Ambulatory Visit
Admission: RE | Admit: 2023-02-04 | Discharge: 2023-02-04 | Disposition: A | Discharge status: Home / Self Care | Payer: PPO | Source: Ambulatory Visit

## 2023-02-04 DIAGNOSIS — Z1231 Encounter for screening mammogram for malignant neoplasm of breast: Secondary | ICD-10-CM | POA: Diagnosis not present

## 2023-02-21 DIAGNOSIS — W010XXA Fall on same level from slipping, tripping and stumbling without subsequent striking against object, initial encounter: Secondary | ICD-10-CM | POA: Diagnosis not present

## 2023-02-21 DIAGNOSIS — S8011XA Contusion of right lower leg, initial encounter: Secondary | ICD-10-CM | POA: Diagnosis not present

## 2023-02-21 DIAGNOSIS — S8391XA Sprain of unspecified site of right knee, initial encounter: Secondary | ICD-10-CM | POA: Diagnosis not present

## 2023-02-21 DIAGNOSIS — L03115 Cellulitis of right lower limb: Secondary | ICD-10-CM | POA: Diagnosis not present

## 2023-04-07 DIAGNOSIS — Z0000 Encounter for general adult medical examination without abnormal findings: Secondary | ICD-10-CM | POA: Diagnosis not present

## 2023-04-07 DIAGNOSIS — Z9181 History of falling: Secondary | ICD-10-CM | POA: Diagnosis not present

## 2023-04-21 DIAGNOSIS — G4733 Obstructive sleep apnea (adult) (pediatric): Secondary | ICD-10-CM | POA: Diagnosis not present

## 2023-04-29 DIAGNOSIS — E1169 Type 2 diabetes mellitus with other specified complication: Secondary | ICD-10-CM | POA: Diagnosis not present

## 2023-04-29 DIAGNOSIS — Z23 Encounter for immunization: Secondary | ICD-10-CM | POA: Diagnosis not present

## 2023-04-29 DIAGNOSIS — G8929 Other chronic pain: Secondary | ICD-10-CM | POA: Diagnosis not present

## 2023-04-29 DIAGNOSIS — I10 Essential (primary) hypertension: Secondary | ICD-10-CM | POA: Diagnosis not present

## 2023-04-29 DIAGNOSIS — K7469 Other cirrhosis of liver: Secondary | ICD-10-CM | POA: Diagnosis not present

## 2023-04-29 DIAGNOSIS — E559 Vitamin D deficiency, unspecified: Secondary | ICD-10-CM | POA: Diagnosis not present

## 2023-04-29 DIAGNOSIS — E785 Hyperlipidemia, unspecified: Secondary | ICD-10-CM | POA: Diagnosis not present

## 2023-07-22 DIAGNOSIS — G4733 Obstructive sleep apnea (adult) (pediatric): Secondary | ICD-10-CM | POA: Diagnosis not present

## 2023-08-04 DIAGNOSIS — E1169 Type 2 diabetes mellitus with other specified complication: Secondary | ICD-10-CM | POA: Diagnosis not present

## 2023-08-04 DIAGNOSIS — R0982 Postnasal drip: Secondary | ICD-10-CM | POA: Diagnosis not present

## 2023-08-04 DIAGNOSIS — K7469 Other cirrhosis of liver: Secondary | ICD-10-CM | POA: Diagnosis not present

## 2023-08-04 DIAGNOSIS — I10 Essential (primary) hypertension: Secondary | ICD-10-CM | POA: Diagnosis not present

## 2023-08-04 DIAGNOSIS — E785 Hyperlipidemia, unspecified: Secondary | ICD-10-CM | POA: Diagnosis not present

## 2023-08-04 DIAGNOSIS — G8929 Other chronic pain: Secondary | ICD-10-CM | POA: Diagnosis not present

## 2023-08-04 DIAGNOSIS — E559 Vitamin D deficiency, unspecified: Secondary | ICD-10-CM | POA: Diagnosis not present

## 2023-09-28 DIAGNOSIS — K7469 Other cirrhosis of liver: Secondary | ICD-10-CM | POA: Diagnosis not present

## 2023-09-29 DIAGNOSIS — K7469 Other cirrhosis of liver: Secondary | ICD-10-CM | POA: Diagnosis not present

## 2023-10-20 DIAGNOSIS — E66813 Obesity, class 3: Secondary | ICD-10-CM | POA: Diagnosis not present

## 2023-10-20 DIAGNOSIS — G4733 Obstructive sleep apnea (adult) (pediatric): Secondary | ICD-10-CM | POA: Diagnosis not present

## 2023-10-20 DIAGNOSIS — J301 Allergic rhinitis due to pollen: Secondary | ICD-10-CM | POA: Diagnosis not present

## 2023-11-04 DIAGNOSIS — J069 Acute upper respiratory infection, unspecified: Secondary | ICD-10-CM | POA: Diagnosis not present

## 2023-11-04 DIAGNOSIS — E559 Vitamin D deficiency, unspecified: Secondary | ICD-10-CM | POA: Diagnosis not present

## 2023-11-04 DIAGNOSIS — G8929 Other chronic pain: Secondary | ICD-10-CM | POA: Diagnosis not present

## 2023-11-04 DIAGNOSIS — E1169 Type 2 diabetes mellitus with other specified complication: Secondary | ICD-10-CM | POA: Diagnosis not present

## 2023-11-04 DIAGNOSIS — E66813 Obesity, class 3: Secondary | ICD-10-CM | POA: Diagnosis not present

## 2023-11-04 DIAGNOSIS — I10 Essential (primary) hypertension: Secondary | ICD-10-CM | POA: Diagnosis not present

## 2023-11-04 DIAGNOSIS — K7469 Other cirrhosis of liver: Secondary | ICD-10-CM | POA: Diagnosis not present

## 2023-11-04 DIAGNOSIS — E785 Hyperlipidemia, unspecified: Secondary | ICD-10-CM | POA: Diagnosis not present

## 2023-11-11 DIAGNOSIS — G4733 Obstructive sleep apnea (adult) (pediatric): Secondary | ICD-10-CM | POA: Diagnosis not present

## 2023-11-11 DIAGNOSIS — Z6841 Body Mass Index (BMI) 40.0 and over, adult: Secondary | ICD-10-CM | POA: Diagnosis not present

## 2023-11-12 DIAGNOSIS — E785 Hyperlipidemia, unspecified: Secondary | ICD-10-CM | POA: Diagnosis not present

## 2023-11-12 DIAGNOSIS — E1169 Type 2 diabetes mellitus with other specified complication: Secondary | ICD-10-CM | POA: Diagnosis not present

## 2023-12-10 DIAGNOSIS — G4733 Obstructive sleep apnea (adult) (pediatric): Secondary | ICD-10-CM | POA: Diagnosis not present

## 2023-12-17 DIAGNOSIS — E785 Hyperlipidemia, unspecified: Secondary | ICD-10-CM | POA: Diagnosis not present

## 2023-12-17 DIAGNOSIS — E559 Vitamin D deficiency, unspecified: Secondary | ICD-10-CM | POA: Diagnosis not present

## 2023-12-17 DIAGNOSIS — G8929 Other chronic pain: Secondary | ICD-10-CM | POA: Diagnosis not present

## 2023-12-17 DIAGNOSIS — I10 Essential (primary) hypertension: Secondary | ICD-10-CM | POA: Diagnosis not present

## 2023-12-17 DIAGNOSIS — E66813 Obesity, class 3: Secondary | ICD-10-CM | POA: Diagnosis not present

## 2023-12-17 DIAGNOSIS — E1169 Type 2 diabetes mellitus with other specified complication: Secondary | ICD-10-CM | POA: Diagnosis not present

## 2023-12-17 DIAGNOSIS — K7469 Other cirrhosis of liver: Secondary | ICD-10-CM | POA: Diagnosis not present

## 2023-12-24 DIAGNOSIS — E785 Hyperlipidemia, unspecified: Secondary | ICD-10-CM | POA: Diagnosis not present

## 2023-12-24 DIAGNOSIS — E1169 Type 2 diabetes mellitus with other specified complication: Secondary | ICD-10-CM | POA: Diagnosis not present

## 2024-01-09 DIAGNOSIS — G4733 Obstructive sleep apnea (adult) (pediatric): Secondary | ICD-10-CM | POA: Diagnosis not present

## 2024-01-21 DIAGNOSIS — E119 Type 2 diabetes mellitus without complications: Secondary | ICD-10-CM | POA: Diagnosis not present

## 2024-01-21 DIAGNOSIS — E669 Obesity, unspecified: Secondary | ICD-10-CM | POA: Diagnosis not present

## 2024-02-07 ENCOUNTER — Other Ambulatory Visit: Payer: Self-pay | Admitting: Medical Genetics

## 2024-02-09 DIAGNOSIS — G4733 Obstructive sleep apnea (adult) (pediatric): Secondary | ICD-10-CM | POA: Diagnosis not present

## 2024-02-10 DIAGNOSIS — G4733 Obstructive sleep apnea (adult) (pediatric): Secondary | ICD-10-CM | POA: Diagnosis not present

## 2024-02-10 DIAGNOSIS — Z6841 Body Mass Index (BMI) 40.0 and over, adult: Secondary | ICD-10-CM | POA: Diagnosis not present

## 2024-03-03 DIAGNOSIS — E785 Hyperlipidemia, unspecified: Secondary | ICD-10-CM | POA: Diagnosis not present

## 2024-03-03 DIAGNOSIS — E1169 Type 2 diabetes mellitus with other specified complication: Secondary | ICD-10-CM | POA: Diagnosis not present

## 2024-03-03 DIAGNOSIS — G4733 Obstructive sleep apnea (adult) (pediatric): Secondary | ICD-10-CM | POA: Diagnosis not present

## 2024-03-22 ENCOUNTER — Other Ambulatory Visit: Payer: Self-pay | Admitting: Internal Medicine

## 2024-03-22 DIAGNOSIS — Z1231 Encounter for screening mammogram for malignant neoplasm of breast: Secondary | ICD-10-CM

## 2024-03-24 DIAGNOSIS — E785 Hyperlipidemia, unspecified: Secondary | ICD-10-CM | POA: Diagnosis not present

## 2024-03-24 DIAGNOSIS — Z23 Encounter for immunization: Secondary | ICD-10-CM | POA: Diagnosis not present

## 2024-03-24 DIAGNOSIS — E1169 Type 2 diabetes mellitus with other specified complication: Secondary | ICD-10-CM | POA: Diagnosis not present

## 2024-03-24 DIAGNOSIS — E119 Type 2 diabetes mellitus without complications: Secondary | ICD-10-CM | POA: Diagnosis not present

## 2024-03-24 DIAGNOSIS — K7469 Other cirrhosis of liver: Secondary | ICD-10-CM | POA: Diagnosis not present

## 2024-03-24 DIAGNOSIS — G8929 Other chronic pain: Secondary | ICD-10-CM | POA: Diagnosis not present

## 2024-03-24 DIAGNOSIS — I10 Essential (primary) hypertension: Secondary | ICD-10-CM | POA: Diagnosis not present

## 2024-03-24 DIAGNOSIS — E559 Vitamin D deficiency, unspecified: Secondary | ICD-10-CM | POA: Diagnosis not present

## 2024-03-24 DIAGNOSIS — R635 Abnormal weight gain: Secondary | ICD-10-CM | POA: Diagnosis not present

## 2024-03-30 ENCOUNTER — Ambulatory Visit

## 2024-03-30 ENCOUNTER — Ambulatory Visit
Admission: RE | Admit: 2024-03-30 | Discharge: 2024-03-30 | Disposition: A | Discharge status: Home / Self Care | Source: Ambulatory Visit | Attending: Internal Medicine | Admitting: Internal Medicine

## 2024-03-30 DIAGNOSIS — Z1231 Encounter for screening mammogram for malignant neoplasm of breast: Secondary | ICD-10-CM | POA: Diagnosis not present

## 2024-04-07 ENCOUNTER — Other Ambulatory Visit: Payer: Self-pay | Admitting: Medical Genetics

## 2024-04-07 DIAGNOSIS — Z006 Encounter for examination for normal comparison and control in clinical research program: Secondary | ICD-10-CM

## 2024-04-10 DIAGNOSIS — G4733 Obstructive sleep apnea (adult) (pediatric): Secondary | ICD-10-CM | POA: Diagnosis not present

## 2024-04-18 DIAGNOSIS — Z9181 History of falling: Secondary | ICD-10-CM | POA: Diagnosis not present

## 2024-04-18 DIAGNOSIS — Z0000 Encounter for general adult medical examination without abnormal findings: Secondary | ICD-10-CM | POA: Diagnosis not present

## 2024-06-30 ENCOUNTER — Ambulatory Visit (HOSPITAL_BASED_OUTPATIENT_CLINIC_OR_DEPARTMENT_OTHER)
Admission: EM | Admit: 2024-06-30 | Discharge: 2024-06-30 | Disposition: A | Discharge status: Home / Self Care | Source: Ambulatory Visit | Attending: Family Medicine | Admitting: Family Medicine

## 2024-06-30 ENCOUNTER — Encounter (HOSPITAL_BASED_OUTPATIENT_CLINIC_OR_DEPARTMENT_OTHER): Payer: Self-pay

## 2024-06-30 ENCOUNTER — Ambulatory Visit (HOSPITAL_BASED_OUTPATIENT_CLINIC_OR_DEPARTMENT_OTHER): Admitting: Radiology

## 2024-06-30 DIAGNOSIS — M25461 Effusion, right knee: Secondary | ICD-10-CM

## 2024-06-30 DIAGNOSIS — S8991XA Unspecified injury of right lower leg, initial encounter: Secondary | ICD-10-CM

## 2024-06-30 DIAGNOSIS — M79604 Pain in right leg: Secondary | ICD-10-CM

## 2024-06-30 MED ORDER — DOXYCYCLINE HYCLATE 100 MG PO CAPS: 100.0000 mg | ORAL_CAPSULE | Freq: Two times a day (BID) | ORAL | 0 refills | Status: AC

## 2024-06-30 MED ORDER — CEFTRIAXONE SODIUM 1 G IJ SOLR
1.0000 g | Freq: Once | INTRAMUSCULAR | Status: AC
  Administered 2024-06-30: 1 g via INTRAMUSCULAR

## 2024-06-30 NOTE — Discharge Instructions (Signed)
 Your x-ray did not show any concerns and you do not have a blood clot.  I am treating you for cellulitis.  Take the doxycycline  as prescribed.  We did give you a shot of antibiotics here today.  For worsening problems you will need to go to the ER.  Follow-up with PCP for recheck next week

## 2024-06-30 NOTE — ED Triage Notes (Addendum)
 Patient fell while at [facility] 9 days ago. Injury to right knee/leg.Was seen at an ER in [place] and x-rays all negative. Patient initially had several abrasions to right knee and presents today with significant wounds around knee. Draining, swelling to right knee, lower leg. Bruising extends up to right groin. Patient is diabetic. Right foot is swollen and bruised. + pulse
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
